# Patient Record
Sex: Male | Born: 1985 | Race: Black or African American | Hispanic: No | Marital: Single | Smoking: Current every day smoker
Health system: Southern US, Community
[De-identification: ages and names within clinical notes are randomized; demographics above are authoritative.]

## PROBLEM LIST (undated history)

## (undated) DIAGNOSIS — J40 Bronchitis, not specified as acute or chronic: Secondary | ICD-10-CM

## (undated) DIAGNOSIS — A549 Gonococcal infection, unspecified: Secondary | ICD-10-CM

## (undated) DIAGNOSIS — A749 Chlamydial infection, unspecified: Secondary | ICD-10-CM

---
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
## 2008-06-27 ENCOUNTER — Emergency Department (HOSPITAL_COMMUNITY): Admission: EM | Admit: 2008-06-27 | Discharge: 2008-06-27 | Payer: Self-pay | Admitting: Emergency Medicine

## 2010-01-04 ENCOUNTER — Emergency Department (HOSPITAL_COMMUNITY): Admission: EM | Admit: 2010-01-04 | Discharge: 2010-01-04 | Payer: Self-pay | Admitting: Family Medicine

## 2010-04-05 IMAGING — CR DG CHEST 2V
2 series · 2 of 2 positions shown · non-contrast
Comparison: None

CLINICAL DATA: Chest pain

CHEST - 2 VIEW

[w chest pa]
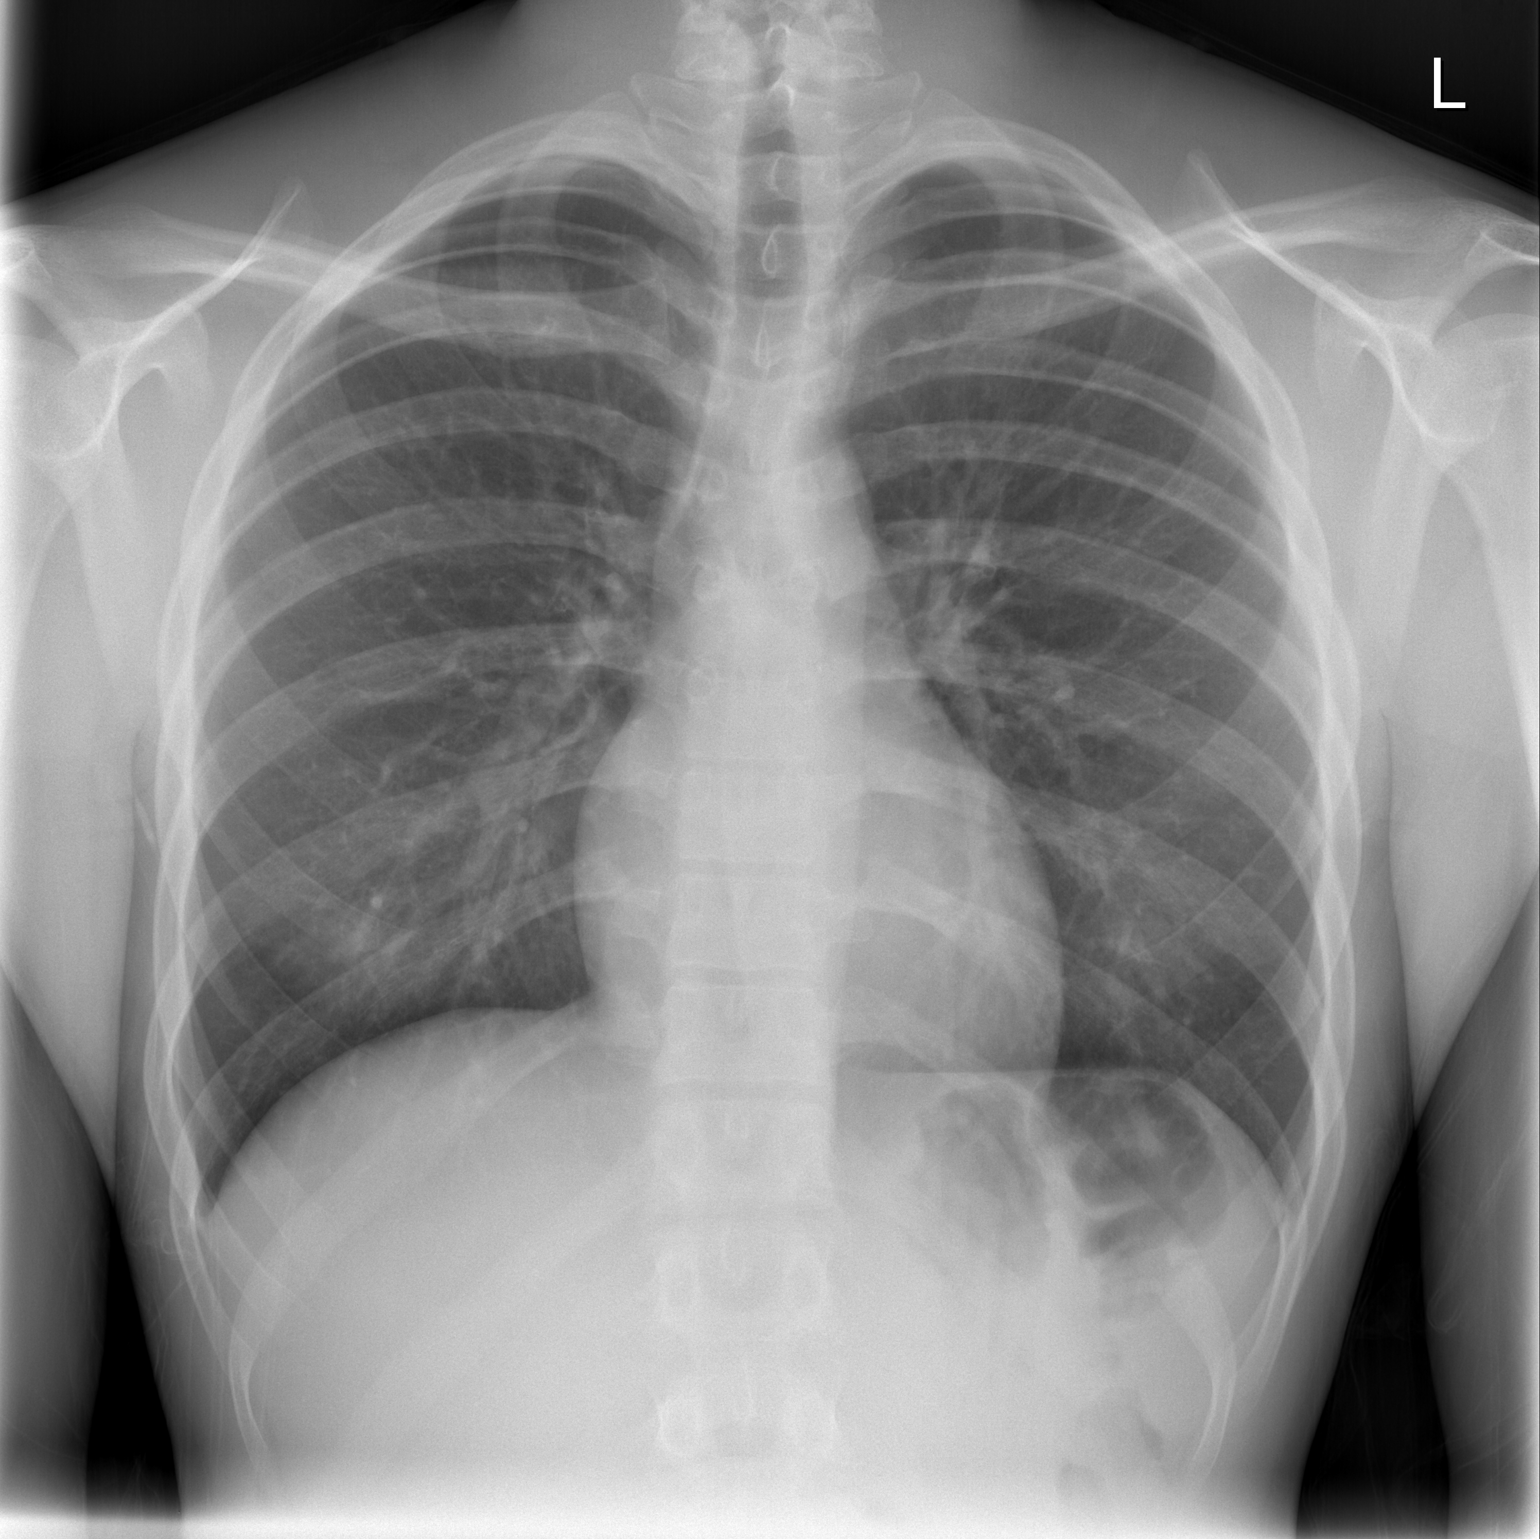

[w chest lat]
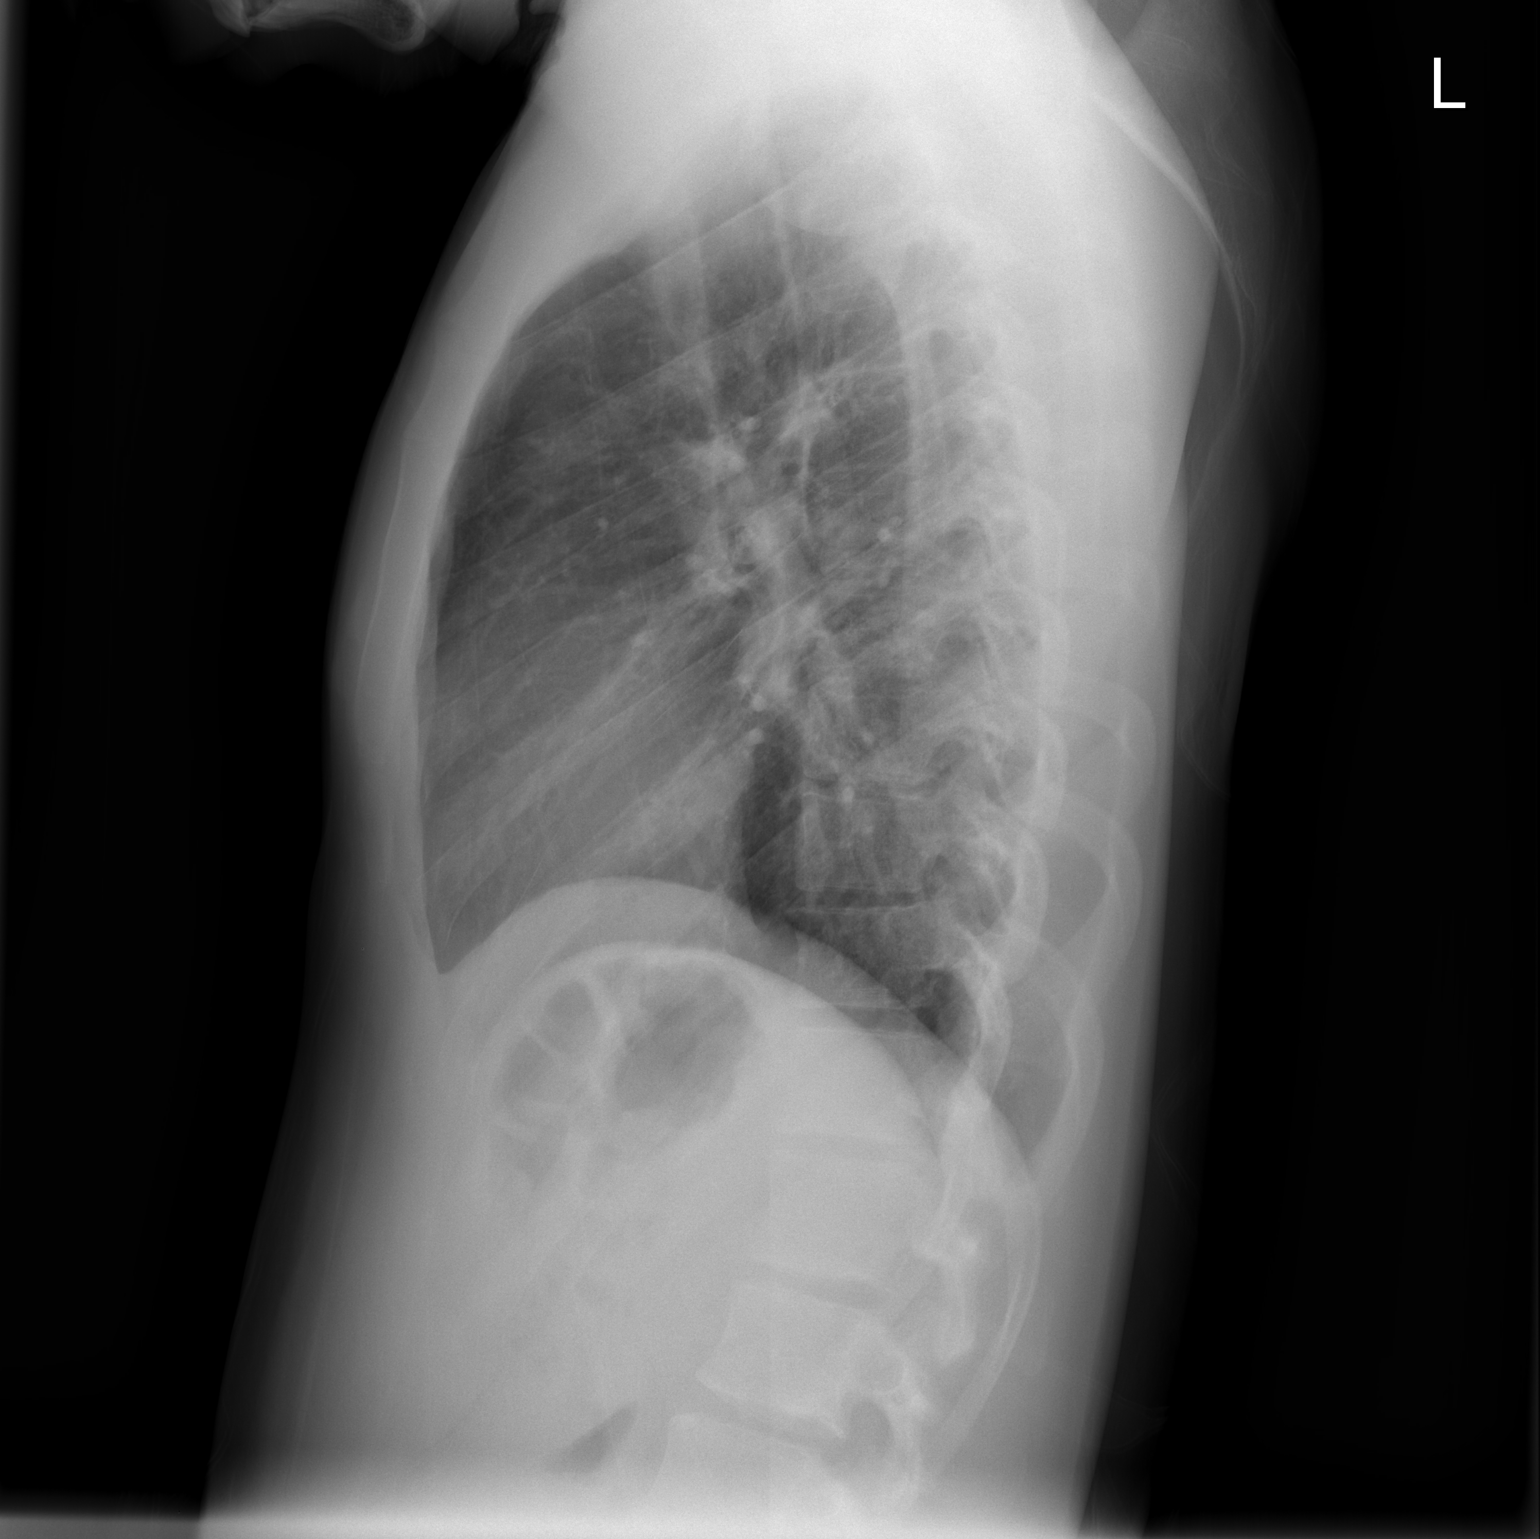

[2 of 2 positions shown; findings below may reference images not displayed]

FINDINGS: The cardiac and mediastinal contours are within normal
limits.  The lungs are clear.  The osseous structures are
unremarkable.
IMPRESSION: No active cardiopulmonary disease.

## 2010-11-22 ENCOUNTER — Inpatient Hospital Stay (INDEPENDENT_AMBULATORY_CARE_PROVIDER_SITE_OTHER)
Admission: RE | Admit: 2010-11-22 | Discharge: 2010-11-22 | Disposition: A | Discharge status: Home / Self Care | Payer: Self-pay | Source: Ambulatory Visit | Attending: Family Medicine | Admitting: Family Medicine

## 2010-11-22 DIAGNOSIS — R198 Other specified symptoms and signs involving the digestive system and abdomen: Secondary | ICD-10-CM

## 2010-11-26 ENCOUNTER — Emergency Department (HOSPITAL_COMMUNITY)
Admission: EM | Admit: 2010-11-26 | Discharge: 2010-11-26 | Disposition: A | Discharge status: Home / Self Care | Payer: Self-pay | Attending: Emergency Medicine | Admitting: Emergency Medicine

## 2010-11-26 DIAGNOSIS — T7840XA Allergy, unspecified, initial encounter: Secondary | ICD-10-CM | POA: Insufficient documentation

## 2010-11-26 DIAGNOSIS — M7989 Other specified soft tissue disorders: Secondary | ICD-10-CM | POA: Insufficient documentation

## 2010-11-26 DIAGNOSIS — IMO0001 Reserved for inherently not codable concepts without codable children: Secondary | ICD-10-CM | POA: Insufficient documentation

## 2010-11-26 DIAGNOSIS — L299 Pruritus, unspecified: Secondary | ICD-10-CM | POA: Insufficient documentation

## 2010-11-26 DIAGNOSIS — I10 Essential (primary) hypertension: Secondary | ICD-10-CM | POA: Insufficient documentation

## 2011-02-17 ENCOUNTER — Emergency Department (INDEPENDENT_AMBULATORY_CARE_PROVIDER_SITE_OTHER)
Admission: EM | Admit: 2011-02-17 | Discharge: 2011-02-17 | Disposition: A | Discharge status: Home / Self Care | Payer: Self-pay | Source: Home / Self Care

## 2011-02-17 ENCOUNTER — Encounter (HOSPITAL_COMMUNITY): Payer: Self-pay | Admitting: *Deleted

## 2011-02-17 DIAGNOSIS — J069 Acute upper respiratory infection, unspecified: Secondary | ICD-10-CM

## 2011-02-17 LAB — POCT RAPID STREP A: Streptococcus, Group A Screen (Direct): NEGATIVE

## 2011-02-17 MED ORDER — IBUPROFEN 800 MG PO TABS: 800.0000 mg | ORAL_TABLET | Freq: Three times a day (TID) | ORAL | Status: AC

## 2011-02-17 NOTE — ED Provider Notes (Signed)
History     CSN: [id number] Arrival date & time: 02/17/2011 11:10 AM   First MD Initiated Contact with Patient 02/17/11 1124      Chief Complaint  Patient presents with  . Sore Throat    pt with onset of sorethroat x 3 days -     (Consider location/radiation/quality/duration/timing/severity/associated sxs/prior treatment) Patient is a 25 y.o. male presenting with pharyngitis. The history is provided by the patient.  Sore Throat This is a new problem. The current episode started 2 days ago. The problem occurs constantly. The problem has not changed since onset.Pertinent negatives include no chest pain, no abdominal pain, no headaches and no shortness of breath. Associated symptoms comments: No fever. The symptoms are relieved by NSAIDs. The treatment provided moderate relief.    History reviewed. No pertinent past medical history.  History reviewed. No pertinent past surgical history.  History reviewed. No pertinent family history.  History  Substance Use Topics  . Smoking status: Not on file  . Smokeless tobacco: Not on file  . Alcohol Use: Not on file      Review of Systems  Constitutional: Negative for fever, chills, activity change, appetite change and fatigue.  HENT: Positive for congestion and sore throat. Negative for ear pain, rhinorrhea, trouble swallowing and voice change.   Eyes: Negative for discharge.  Respiratory: Negative for cough, chest tightness, shortness of breath and wheezing.   Cardiovascular: Negative for chest pain.  Gastrointestinal: Negative for nausea and abdominal pain.  Musculoskeletal: Negative for myalgias and arthralgias.  Skin: Negative for rash.  Neurological: Negative for headaches.    Allergies  Review of patient's allergies indicates no known allergies.  Home Medications   Current Outpatient Rx  Name Route Sig Dispense Refill  . IBUPROFEN 800 MG PO TABS Oral Take 1 tablet (800 mg total) by mouth 3 (three) times daily. 21  tablet 0    BP 122/77  Pulse 65  Temp(Src) 98.3 F (36.8 C) (Oral)  Resp 20  SpO2 98%  Physical Exam  Nursing note and vitals reviewed. Constitutional: He is oriented to person, place, and time. He appears well-developed and well-nourished. No distress.  HENT:  Head: Normocephalic.  Right Ear: External ear normal.  Left Ear: External ear normal.  Mouth/Throat: Oropharynx is clear and moist. No oropharyngeal exudate.       Nasal turbinates with erythema and swelling. Pharyngeal erythema.  Neck: Normal range of motion.  Cardiovascular: Normal rate, regular rhythm and normal heart sounds.   Pulmonary/Chest: Effort normal and breath sounds normal. No respiratory distress. He has no wheezes. He has no rales. He exhibits no tenderness.  Abdominal: Soft. Bowel sounds are normal. There is no tenderness.  Lymphadenopathy:    He has no cervical adenopathy.  Neurological: He is alert and oriented to person, place, and time.  Skin: No rash noted.    ED Course  Procedures (including critical care time)   Labs Reviewed  POCT RAPID STREP A ([facility] CARE ONLY)  LAB REPORT - SCANNED   No results found.   1. URI (upper respiratory infection)       MDM   Negative rapid strep       Kei Mcelhiney Moreno-Coll 02/18/11 0742

## 2012-02-23 ENCOUNTER — Emergency Department (HOSPITAL_COMMUNITY): Payer: Self-pay

## 2012-02-23 ENCOUNTER — Encounter (HOSPITAL_COMMUNITY): Payer: Self-pay | Admitting: Physical Medicine and Rehabilitation

## 2012-02-23 ENCOUNTER — Emergency Department (HOSPITAL_COMMUNITY)
Admission: EM | Admit: 2012-02-23 | Discharge: 2012-02-23 | Disposition: A | Discharge status: Home / Self Care | Payer: Self-pay | Attending: Emergency Medicine | Admitting: Emergency Medicine

## 2012-02-23 DIAGNOSIS — R079 Chest pain, unspecified: Secondary | ICD-10-CM | POA: Insufficient documentation

## 2012-02-23 DIAGNOSIS — F17200 Nicotine dependence, unspecified, uncomplicated: Secondary | ICD-10-CM | POA: Insufficient documentation

## 2012-02-23 MED ORDER — OXYCODONE-ACETAMINOPHEN 5-325 MG PO TABS
1.0000 | ORAL_TABLET | Freq: Once | ORAL | Status: AC
  Administered 2012-02-23: 1 via ORAL
  Filled 2012-02-23: qty 1

## 2012-02-23 MED ORDER — IBUPROFEN 400 MG PO TABS
600.0000 mg | ORAL_TABLET | Freq: Once | ORAL | Status: AC
  Administered 2012-02-23: 600 mg via ORAL
  Filled 2012-02-23: qty 1

## 2012-02-23 MED ORDER — IBUPROFEN 600 MG PO TABS: 600.0000 mg | ORAL_TABLET | Freq: Four times a day (QID) | ORAL | Status: DC | PRN

## 2012-02-23 NOTE — ED Provider Notes (Signed)
History  This chart was scribed for Raeford Razor, MD by Ladona Ridgel Day, ED scribe. This patient was seen in room TR06C/TR06C and the patient's care was started at 1459.   CSN: [id number]  Arrival date & time 02/23/12  1459   None     Chief Complaint  Patient presents with  . Chest Pain    L sided rib pain   Patient is a 26 y.o. male presenting with chest pain. The history is provided by the patient. No language interpreter was used.  Chest Pain The chest pain began 5 - 7 days ago. Chest pain occurs constantly. The chest pain is unchanged. The pain is associated with breathing. The severity of the pain is moderate. The quality of the pain is described as aching. The pain does not radiate. Chest pain is worsened by deep breathing. Pertinent negatives for primary symptoms include no fever, no shortness of breath, no cough, no nausea and no vomiting.  Pertinent negatives for associated symptoms include no weakness. He tried NSAIDs for the symptoms.   Larry Alvarez is a 26 y.o. male who presents to the Emergency Department complaining of constant gradually worsening throbbing left rib pain for the past week worse with deep breaths. He denies any recent injuries to the area and states has been taking ibuprofen at home with minimal relief. He denies any similar previous episodes and has not yet seen anyone for this problem. He is a smoker and denies any leg pain/swelling/recent surgeries or history of DVT/PE.  No past medical history on file.  No past surgical history on file.  History reviewed. No pertinent family history.  History  Substance Use Topics  . Smoking status: Current Every Day Smoker    Types: Cigarettes  . Smokeless tobacco: Not on file  . Alcohol Use: 1.2 oz/week    2 Cans of beer per week      Review of Systems  Constitutional: Negative for fever and chills.  Respiratory: Negative for cough and shortness of breath.   Cardiovascular: Positive for chest pain (left  rib pain worse with deep breaths).  Gastrointestinal: Negative for nausea and vomiting.  Neurological: Negative for weakness.  All other systems reviewed and are negative.    Allergies  Review of patient's allergies indicates no known allergies.  Home Medications   Current Outpatient Rx  Name  Route  Sig  Dispense  Refill  . IBUPROFEN 200 MG PO TABS   Oral   Take 400 mg by mouth every 6 (six) hours as needed. For pain           Triage Vitals: BP 140/72  Pulse 81  Temp 98.6 F (37 C) (Oral)  Resp 18  SpO2 99%  Physical Exam  Nursing note and vitals reviewed. Constitutional: He appears well-developed and well-nourished. No distress.  HENT:  Head: Normocephalic and atraumatic.  Eyes: Conjunctivae normal are normal. Right eye exhibits no discharge. Left eye exhibits no discharge.  Neck: Neck supple.  Cardiovascular: Normal rate, regular rhythm and normal heart sounds.  Exam reveals no gallop and no friction rub.   No murmur heard. Pulmonary/Chest: Effort normal and breath sounds normal. No respiratory distress. He has no wheezes. He has no rales. He exhibits no tenderness.       Lungs clear with good air movement  Abdominal: Soft. He exhibits no distension. There is no tenderness.  Musculoskeletal: He exhibits no edema and no tenderness.       No leg swelling, tenderness, or  edema  Neurological: He is alert.  Skin: Skin is warm and dry.       No skin lesions  Psychiatric: He has a normal mood and affect. His behavior is normal. Thought content normal.    ED Course  Procedures (including critical care time) DIAGNOSTIC STUDIES: Oxygen Saturation is 99% on room air, normal by my interpretation.    COORDINATION OF CARE: At 530 PM Discussed treatment plan with patient which includes CXR. Patient agrees.   Labs Reviewed - No data to display Dg Chest 2 View  02/23/2012  *RADIOLOGY REPORT*  Clinical Data: Shortness of breath and left-sided chest pain for 3 days  CHEST  - 2 VIEW  Comparison: 06/27/2008  Findings:  The heart size and mediastinal contours are within normal limits.  Both lungs are clear.  The visualized skeletal structures are unremarkable. No change from priors.  IMPRESSION: No active cardiopulmonary disease.   Original Report Authenticated By: Davonna Belling, M.D.     EKG:  Rhythm: normal sinus Rate: 67 Axis: normal Intervals: normal ST segments: normal  1. Chest pain       MDM  26yM with CP. EKG with no ischemic changes and normal intervals. CXR with no focal findings. Not tachy. Afebrile. No SOB, no distress on exam and 02 sats good on RA. Doubt ACS, Infectious, PE, Dissection, Pericarditis. Low suspicion for emergent cause and feel safe for outpt fu. Return precautions discussed.  I personally preformed the services scribed in my presence. The recorded information has been reviewed & is accurate. Raeford Razor, MD.          Raeford Razor, MD 02/23/12 [phone]

## 2012-02-23 NOTE — ED Notes (Signed)
Pt returned from radiology.

## 2012-02-23 NOTE — ED Notes (Signed)
Pt c/o left lateral side pain along with some SOB that started a week ago, pt reports he doesn't know what time it started or what he was doing when it started but the pain has progressively gotten worse over the past couple of days. Pt skin is warm dry and pink.

## 2012-02-23 NOTE — ED Notes (Signed)
Pt presents to department for evaluation of L sided rib pain. Ongoing x1 week. Denies recent injury. Pain increases with deep breathing. Respirations unlabored. He is alert and oriented x4. Relief of pain with ibuprofen at home.

## 2012-03-24 ENCOUNTER — Encounter (HOSPITAL_COMMUNITY): Payer: Self-pay | Admitting: Emergency Medicine

## 2012-03-24 ENCOUNTER — Other Ambulatory Visit (HOSPITAL_COMMUNITY)
Admission: RE | Admit: 2012-03-24 | Discharge: 2012-03-24 | Disposition: A | Discharge status: Home / Self Care | Payer: Self-pay | Source: Ambulatory Visit | Attending: Emergency Medicine | Admitting: Emergency Medicine

## 2012-03-24 ENCOUNTER — Emergency Department (INDEPENDENT_AMBULATORY_CARE_PROVIDER_SITE_OTHER)
Admission: EM | Admit: 2012-03-24 | Discharge: 2012-03-24 | Disposition: A | Discharge status: Home / Self Care | Payer: Self-pay | Source: Home / Self Care

## 2012-03-24 DIAGNOSIS — Z113 Encounter for screening for infections with a predominantly sexual mode of transmission: Secondary | ICD-10-CM | POA: Insufficient documentation

## 2012-03-24 DIAGNOSIS — A64 Unspecified sexually transmitted disease: Secondary | ICD-10-CM

## 2012-03-24 DIAGNOSIS — Z202 Contact with and (suspected) exposure to infections with a predominantly sexual mode of transmission: Secondary | ICD-10-CM

## 2012-03-24 MED ORDER — AZITHROMYCIN 250 MG PO TABS
1000.0000 mg | ORAL_TABLET | Freq: Every day | ORAL | Status: DC
  Administered 2012-03-24: 1000 mg via ORAL

## 2012-03-24 MED ORDER — CEFTRIAXONE SODIUM 250 MG IJ SOLR
250.0000 mg | Freq: Once | INTRAMUSCULAR | Status: AC
  Administered 2012-03-24: 250 mg via INTRAMUSCULAR

## 2012-03-24 MED ORDER — AZITHROMYCIN 250 MG PO TABS
ORAL_TABLET | ORAL | Status: AC
  Filled 2012-03-24: qty 4

## 2012-03-24 MED ORDER — LIDOCAINE HCL (PF) 1 % IJ SOLN
INTRAMUSCULAR | Status: AC
  Filled 2012-03-24: qty 5

## 2012-03-24 MED ORDER — CEFTRIAXONE SODIUM 250 MG IJ SOLR
INTRAMUSCULAR | Status: AC
  Filled 2012-03-24: qty 250

## 2012-03-24 NOTE — ED Notes (Signed)
Given gingerale/ice/graham crackers to avoid ingestion of antibiotics on an empty stomach

## 2012-03-24 NOTE — ED Provider Notes (Signed)
Medical screening examination/treatment/procedure(s) were performed by non-physician practitioner and as supervising physician I was immediately available for consultation/collaboration.  Antwan Bribiesca, M.D.   Simranjit Thayer C Phat Dalton, MD 03/24/12 1355 

## 2012-03-24 NOTE — ED Provider Notes (Signed)
History     CSN: [id number]  Arrival date & time 03/24/12  1001   None     Chief Complaint  Patient presents with  . Exposure to STD    (Consider location/radiation/quality/duration/timing/severity/associated sxs/prior treatment) HPI Comments: This 26 year old male presents with a two-day history of burning sensation in the urethra medically upon urination. He also feels a tingling sensation and has witnessed a penile discharge. He denies fever, chills, pelvic pain, back pain or other urinary symptoms such as frequency. He has a history of STD disease.  The history is provided by the patient.    History reviewed. No pertinent past medical history.  History reviewed. No pertinent past surgical history.  No family history on file.  History  Substance Use Topics  . Smoking status: Current Every Day Smoker    Types: Cigarettes  . Smokeless tobacco: Not on file  . Alcohol Use: 1.2 oz/week    2 Cans of beer per week      Review of Systems  All other systems reviewed and are negative.    Allergies  Review of patient's allergies indicates no known allergies.  Home Medications   Current Outpatient Rx  Name  Route  Sig  Dispense  Refill  . IBUPROFEN 200 MG PO TABS   Oral   Take 400 mg by mouth every 6 (six) hours as needed. For pain         . IBUPROFEN 600 MG PO TABS   Oral   Take 1 tablet (600 mg total) by mouth every 6 (six) hours as needed for pain.   30 tablet   0     BP 115/70  Pulse 76  Temp 97.3 F (36.3 C) (Oral)  Resp 18  SpO2 98%  Physical Exam  Constitutional: He is oriented to person, place, and time. He appears well-developed and well-nourished. No distress.  Eyes: Conjunctivae normal and EOM are normal.  Neck: Normal range of motion. Neck supple.  Pulmonary/Chest: Effort normal.  Genitourinary: Penis normal. No penile tenderness.       There is a clear mucoid drainage from the meatus. Penile shaft is intact without apparent lesions. No  palpable regional adenopathy. No tenderness in the epididymal apparatus or testicles. The testicles descended and no palpable nodules or other abnormal contents within the scrotum.  Neurological: He is alert and oriented to person, place, and time. No cranial nerve deficit.  Skin: Skin is warm and dry. No rash noted. No erythema.    ED Course  Procedures (including critical care time)   Labs Reviewed  URINE CYTOLOGY ANCILLARY ONLY   No results found.   1. STD (male)   2. Exposure to STD       MDM  Seen urine for GC Chlamydia Rocephin 200 mg IM now Azithromycin 1000 mg by mouth now Please return instructions for behavior and activity in reference to STD .         Hayden Rasmussen, NP 03/24/12 1049

## 2012-03-24 NOTE — ED Notes (Signed)
Patient reports waking this am with tingling feeling, and discharge noticed this am.  History of gonorrhea.

## 2012-03-24 NOTE — ED Notes (Signed)
Patient aware of post-injection delay prior to discharge per policy.

## 2012-05-05 ENCOUNTER — Encounter (HOSPITAL_COMMUNITY): Payer: Self-pay | Admitting: Emergency Medicine

## 2012-05-05 ENCOUNTER — Other Ambulatory Visit (HOSPITAL_COMMUNITY)
Admission: RE | Admit: 2012-05-05 | Discharge: 2012-05-05 | Disposition: A | Discharge status: Home / Self Care | Payer: Self-pay | Source: Ambulatory Visit | Attending: Family Medicine | Admitting: Family Medicine

## 2012-05-05 ENCOUNTER — Emergency Department (INDEPENDENT_AMBULATORY_CARE_PROVIDER_SITE_OTHER)
Admission: EM | Admit: 2012-05-05 | Discharge: 2012-05-05 | Disposition: A | Discharge status: Home / Self Care | Payer: Self-pay | Source: Home / Self Care | Attending: Family Medicine | Admitting: Family Medicine

## 2012-05-05 DIAGNOSIS — Z113 Encounter for screening for infections with a predominantly sexual mode of transmission: Secondary | ICD-10-CM | POA: Insufficient documentation

## 2012-05-05 DIAGNOSIS — N342 Other urethritis: Secondary | ICD-10-CM

## 2012-05-05 MED ORDER — CEFTRIAXONE SODIUM 250 MG IJ SOLR
250.0000 mg | Freq: Once | INTRAMUSCULAR | Status: AC
  Administered 2012-05-05: 250 mg via INTRAMUSCULAR

## 2012-05-05 MED ORDER — AZITHROMYCIN 250 MG PO TABS
1000.0000 mg | ORAL_TABLET | Freq: Once | ORAL | Status: AC
  Administered 2012-05-05: 1000 mg via ORAL

## 2012-05-05 MED ORDER — CEFTRIAXONE SODIUM 250 MG IJ SOLR
INTRAMUSCULAR | Status: AC
  Filled 2012-05-05: qty 250

## 2012-05-05 MED ORDER — LIDOCAINE HCL (PF) 1 % IJ SOLN
INTRAMUSCULAR | Status: AC
  Filled 2012-05-05: qty 5

## 2012-05-05 MED ORDER — AZITHROMYCIN 250 MG PO TABS
ORAL_TABLET | ORAL | Status: AC
  Filled 2012-05-05: qty 4

## 2012-05-05 NOTE — ED Notes (Signed)
Pt is here for STD exposure Reports white penile discharge x2 days Sx include: dysuria Denies: hematuria, f/v/n/d  He is alert w/no signs of acute distress.

## 2012-05-05 NOTE — ED Provider Notes (Signed)
History     CSN: [id number]  Arrival date & time 05/05/12  1021   First MD Initiated Contact with Patient 05/05/12 1022      Chief Complaint  Patient presents with  . Exposure to STD    (Consider location/radiation/quality/duration/timing/severity/associated sxs/prior treatment) HPI Comments: 27 year old male with history of GC and Chlamydia about 2 months ago [facility] Here complaining of white discharge from penis and burning on urination for 2 days. Denies fever or chills. No abdominal pain. No flank pain. Otherwise feeling well. States he was recently checked for HIV had to help apartment and declining any blood test or further STD screening other than urethral swab today.   History reviewed. No pertinent past medical history.  History reviewed. No pertinent past surgical history.  No family history on file.  History  Substance Use Topics  . Smoking status: Current Every Day Smoker    Types: Cigarettes  . Smokeless tobacco: Not on file  . Alcohol Use: 1.2 oz/week    2 Cans of beer per week      Review of Systems  Constitutional: Negative for fever and chills.  Eyes: Negative for redness and visual disturbance.  Gastrointestinal: Negative for abdominal pain.  Genitourinary: Positive for dysuria, frequency and discharge. Negative for urgency, hematuria, flank pain, scrotal swelling and testicular pain.  Musculoskeletal: Negative for joint swelling and arthralgias.  Skin: Negative for rash.  Neurological: Negative for dizziness and headaches.    Allergies  Review of patient's allergies indicates no known allergies.  Home Medications   Current Outpatient Rx  Name  Route  Sig  Dispense  Refill  . IBUPROFEN 200 MG PO TABS   Oral   Take 400 mg by mouth every 6 (six) hours as needed. For pain         . IBUPROFEN 600 MG PO TABS   Oral   Take 1 tablet (600 mg total) by mouth every 6 (six) hours as needed for pain.   30 tablet   0      BP 119/78  Pulse 70  Temp 98 F (36.7 C) (Oral)  Resp 16  SpO2 100%  Physical Exam  Nursing note and vitals reviewed. Constitutional: He is oriented to person, place, and time. He appears well-developed and well-nourished. No distress.  HENT:  Head: Normocephalic and atraumatic.  Mouth/Throat: No oropharyngeal exudate.  Eyes: No scleral icterus.  Cardiovascular: Normal heart sounds.   Pulmonary/Chest: Breath sounds normal.  Abdominal: Soft. There is no tenderness. Hernia confirmed negative in the right inguinal area and confirmed negative in the left inguinal area.  Genitourinary: Testes normal. Circumcised. No phimosis, paraphimosis or hypospadias. Discharge found.  Lymphadenopathy:    He has no cervical adenopathy.       Right: No inguinal adenopathy present.       Left: No inguinal adenopathy present.  Neurological: He is alert and oriented to person, place, and time.  Skin: No rash noted. He is not diaphoretic.    ED Course  Procedures (including critical care time)   Labs Reviewed  CERVICOVAGINAL ANCILLARY ONLY   No results found.   1. Urethritis       MDM  27 year old male with urethritis. Treated with Rocephin 250 mg IM x1 and a azithromycin 1 g oral x1. Encouraged condoms use. Declined blood sample for HIV testing or other STD screening. GC and Chlamydia test pending at the time of discharge. Supportive care and red flags that should prompt his return  to medical attention discussed with patient and provided in writing.          Sharin Grave, MD 05/07/12 1204

## 2012-07-12 ENCOUNTER — Encounter (HOSPITAL_COMMUNITY): Payer: Self-pay | Admitting: *Deleted

## 2012-07-12 ENCOUNTER — Emergency Department (INDEPENDENT_AMBULATORY_CARE_PROVIDER_SITE_OTHER): Payer: Self-pay

## 2012-07-12 ENCOUNTER — Other Ambulatory Visit (HOSPITAL_COMMUNITY)
Admission: RE | Admit: 2012-07-12 | Discharge: 2012-07-12 | Disposition: A | Discharge status: Home / Self Care | Payer: Self-pay | Source: Ambulatory Visit | Attending: Internal Medicine | Admitting: Internal Medicine

## 2012-07-12 ENCOUNTER — Emergency Department (HOSPITAL_COMMUNITY)
Admission: EM | Admit: 2012-07-12 | Discharge: 2012-07-12 | Disposition: A | Discharge status: Home / Self Care | Payer: Self-pay | Source: Home / Self Care | Attending: Emergency Medicine | Admitting: Emergency Medicine

## 2012-07-12 DIAGNOSIS — Z113 Encounter for screening for infections with a predominantly sexual mode of transmission: Secondary | ICD-10-CM | POA: Insufficient documentation

## 2012-07-12 DIAGNOSIS — S63599A Other specified sprain of unspecified wrist, initial encounter: Secondary | ICD-10-CM

## 2012-07-12 DIAGNOSIS — S66819A Strain of other specified muscles, fascia and tendons at wrist and hand level, unspecified hand, initial encounter: Secondary | ICD-10-CM

## 2012-07-12 DIAGNOSIS — A64 Unspecified sexually transmitted disease: Secondary | ICD-10-CM

## 2012-07-12 MED ORDER — CEFTRIAXONE SODIUM 250 MG IJ SOLR
250.0000 mg | Freq: Once | INTRAMUSCULAR | Status: AC
  Administered 2012-07-12: 250 mg via INTRAMUSCULAR

## 2012-07-12 MED ORDER — AZITHROMYCIN 250 MG PO TABS
ORAL_TABLET | ORAL | Status: AC
  Filled 2012-07-12: qty 4

## 2012-07-12 MED ORDER — LIDOCAINE HCL (PF) 1 % IJ SOLN
INTRAMUSCULAR | Status: AC
  Filled 2012-07-12: qty 5

## 2012-07-12 MED ORDER — AZITHROMYCIN 250 MG PO TABS
1000.0000 mg | ORAL_TABLET | Freq: Once | ORAL | Status: AC
  Administered 2012-07-12: 1000 mg via ORAL

## 2012-07-12 MED ORDER — CEFTRIAXONE SODIUM 250 MG IJ SOLR
INTRAMUSCULAR | Status: AC
  Filled 2012-07-12: qty 250

## 2012-07-12 NOTE — ED Notes (Signed)
Pt  Has   2  Complaints  He  States   He  Struck a  Wall  With  His  r  Hand    On a  Wall   He  Has      Pain  Redness  And  Swelling  To  The  Affected  Hand  -  He  Also  States  He   Has  A  Penile  Discharge        X  1  Week

## 2012-07-12 NOTE — ED Notes (Signed)
Unable to locate discharge instructions

## 2012-07-12 NOTE — ED Provider Notes (Signed)
History     CSN: [id number]  Arrival date & time 07/12/12  1100   First MD Initiated Contact with Patient 07/12/12 1106      Chief Complaint  Patient presents with  . Hand Injury    (Consider location/radiation/quality/duration/timing/severity/associated sxs/prior treatment) HPI     This is a 27 year old male who presents with right hand and wrist pain after hitting a wall 2 days ago. Most of the pain is on the medial aspect of the hand and wrist. There and no lacerations present. He is able to move his fingers but has pain when trying to move his thumb and wrist. He also complains of urethral discharge for about a week and would like to be checked for STDs. He admits to discomfort while urinating but no pus or blood. Discharge is white/clear. No pelvic or testicular pain and no fevers.  History reviewed. No pertinent past surgical history.  No family history on file.  History  Substance Use Topics  . Smoking status: Current Every Day Smoker    Types: Cigarettes  . Smokeless tobacco: Not on file  . Alcohol Use: 1.2 oz/week    2 Cans of beer per week      Review of Systems  Constitutional: Negative.   HENT: Negative.   Eyes: Negative.   Respiratory: Negative.   Cardiovascular: Negative.   Gastrointestinal: Negative.   Endocrine: Negative.   Genitourinary: Positive for dysuria and discharge. Negative for urgency, frequency, hematuria, flank pain, decreased urine volume, penile swelling, scrotal swelling, enuresis, difficulty urinating, genital sores, penile pain and testicular pain.  Musculoskeletal:       Per history of present illness  Skin: Negative.   Neurological: Negative.   Psychiatric/Behavioral: Negative.     Allergies  Review of patient's allergies indicates no known allergies.  Home Medications   No current outpatient prescriptions on file.  BP 120/80  Pulse 68  Temp(Src) 98.1 F (36.7 C) (Oral)  Resp 14  SpO2 99%  Physical Exam   Constitutional: He is oriented to person, place, and time. He appears well-developed and well-nourished.  HENT:  Head: Normocephalic and atraumatic.  Eyes: Conjunctivae and EOM are normal. Pupils are equal, round, and reactive to light.  Neck: Normal range of motion. Neck supple.  Cardiovascular: Normal rate and regular rhythm.   Pulmonary/Chest: Effort normal and breath sounds normal.  Abdominal: Soft. Bowel sounds are normal.  Genitourinary: Penis normal. No penile tenderness.  Musculoskeletal:  Swelling noted in medial aspect of wrist and hand. Range of motion is intact but mild pain as noted when flexing and extending his stump. Tenderness noted in medial aspect of hand and wrist.  Neurological: He is alert and oriented to person, place, and time.  Skin: Skin is warm and dry.    ED Course  Procedures (including critical care time)  Labs Reviewed  URINE CYTOLOGY ANCILLARY ONLY   Dg Wrist Complete Right  07/12/2012  *RADIOLOGY REPORT*  Clinical Data: Right wrist injury with pain.  RIGHT WRIST - COMPLETE 3+ VIEW  Comparison:  None.  Findings:  There is no evidence of fracture or dislocation.  There is no evidence of arthropathy or other focal bone abnormality. Soft tissues are unremarkable.  IMPRESSION: Negative.   Original Report Authenticated By: Irish Lack, M.D.    Dg Hand Complete Right  07/12/2012  *RADIOLOGY REPORT*  Clinical Data: Injury to right hand with pain and abrasions.  RIGHT HAND - COMPLETE 3+ VIEW  Comparison:  None.  Findings:  There  is no evidence of fracture or dislocation.  There is no evidence of arthropathy or other focal bone abnormality. Soft tissues are unremarkable.  IMPRESSION: Negative.   Original Report Authenticated By: Irish Lack, M.D.      1. Wrist sprain and strain, right, initial encounter   2. STD (male)       MDM  Splint given for wrist.Advised to elevate and ice. STD testing-give Rocephin 250 mg IV one time and Zithromax 1 g by mouth  once.         Calvert Cantor, MD 07/12/12 [phone]

## 2012-07-12 NOTE — ED Notes (Signed)
[facility]  r   Wrist   Splint  Applied    

## 2012-07-16 ENCOUNTER — Telehealth (HOSPITAL_COMMUNITY): Payer: Self-pay | Admitting: *Deleted

## 2012-07-16 MED ORDER — METRONIDAZOLE 500 MG PO TABS: 500.0000 mg | ORAL_TABLET | Freq: Two times a day (BID) | ORAL | Status: DC

## 2012-07-16 NOTE — ED Provider Notes (Signed)
Addendum: His DNA probe came back positive for Trichomonas. GC and Chlamydia were negative. He will need treatment with metronidazole 500 mg twice a day for one week. He should inform sexual contacts and had been treated as well.  Reuben Likes, MD 07/16/12 [phone]

## 2012-07-16 NOTE — ED Notes (Signed)
GC/Chlamydia neg., Trich pos.  4/7 Message sent to Dr. Butler Denmark for orders.  4/9 No order noted. Discussed with Dr. Lorenz Coaster and he e-prescribed Flagyl to [facility] on [address]. I called pt.  Pt. verified x 2 and given results. Pt. told he needs Flagyl for Trich. And where to get Rx. He wants it sent to [facility]. I told him he can get it transferred there and how to do it.   Pt. instructed to no alcohol while taking this medication.  Pt. instructed to notify his partner to be treated with Flagyl, no sex until you have finished your medication and your partner has been treated and to practice safe sex. You can get HIV testing at the [facility] STD clinic by appointment.  Pt. voiced understanding. Vassie Moselle 07/16/2012

## 2012-07-18 ENCOUNTER — Emergency Department (HOSPITAL_COMMUNITY)
Admission: EM | Admit: 2012-07-18 | Discharge: 2012-07-18 | Disposition: A | Discharge status: Home / Self Care | Payer: Self-pay | Attending: Emergency Medicine | Admitting: Emergency Medicine

## 2012-07-18 ENCOUNTER — Encounter (HOSPITAL_COMMUNITY): Payer: Self-pay | Admitting: Cardiology

## 2012-07-18 DIAGNOSIS — Z202 Contact with and (suspected) exposure to infections with a predominantly sexual mode of transmission: Secondary | ICD-10-CM | POA: Insufficient documentation

## 2012-07-18 DIAGNOSIS — N489 Disorder of penis, unspecified: Secondary | ICD-10-CM | POA: Insufficient documentation

## 2012-07-18 DIAGNOSIS — F17200 Nicotine dependence, unspecified, uncomplicated: Secondary | ICD-10-CM | POA: Insufficient documentation

## 2012-07-18 LAB — URINALYSIS, ROUTINE W REFLEX MICROSCOPIC
Glucose, UA: NEGATIVE mg/dL
Ketones, ur: NEGATIVE mg/dL
Leukocytes, UA: NEGATIVE
Protein, ur: NEGATIVE mg/dL
Urobilinogen, UA: 1 mg/dL (ref 0.0–1.0)

## 2012-07-18 LAB — URINE MICROSCOPIC-ADD ON

## 2012-07-18 MED ORDER — AZITHROMYCIN 250 MG PO TABS
1000.0000 mg | ORAL_TABLET | Freq: Once | ORAL | Status: AC
  Administered 2012-07-18: 1000 mg via ORAL
  Filled 2012-07-18: qty 4

## 2012-07-18 MED ORDER — CEFTRIAXONE SODIUM 250 MG IJ SOLR
250.0000 mg | Freq: Once | INTRAMUSCULAR | Status: AC
  Administered 2012-07-18: 250 mg via INTRAMUSCULAR
  Filled 2012-07-18: qty 250

## 2012-07-18 NOTE — ED Notes (Signed)
Pt reports he had a partner that tested positive for STD a couple of days ago. Reports he has been symptomatic.

## 2012-07-18 NOTE — ED Provider Notes (Signed)
Medical screening examination/treatment/procedure(s) were performed by non-physician practitioner and as supervising physician I was immediately available for consultation/collaboration.  Ethelda Chick, MD 07/18/12 1728

## 2012-07-18 NOTE — ED Provider Notes (Signed)
History    This chart was scribed for non-physician practitioner working with Ethelda Chick, MD by Toya Smothers, ED Scribe. This patient was seen in room TR09C/TR09C and the patient's care was started at 3:53 PM.   CSN: [id number]  Arrival date & time 07/18/12  1450   First MD Initiated Contact with Patient 07/18/12 1529      Chief Complaint  Patient presents with  . SEXUALLY TRANSMITTED DISEASE    The history is provided by the patient. No language interpreter was used.    Larry Alvarez is a 27 y.o. male who presents to the ED complaining of 2 days of gradual onset, constant, unchanged mild penile pain without aggravation. Pt states that his partner was recently diagnosed with Trichomoniasis. No discharge, swelling, rash, or genital sores. .Symptoms have not been treated PTA. Pt is sexually active, admitting alcohol, tobacco, and marijuana use.    History reviewed. No pertinent past medical history.  History reviewed. No pertinent past surgical history.  History reviewed. No pertinent family history.  History  Substance Use Topics  . Smoking status: Current Every Day Smoker    Types: Cigarettes  . Smokeless tobacco: Not on file  . Alcohol Use: 1.2 oz/week    2 Cans of beer per week      Review of Systems  Genitourinary: Positive for penile pain.  All other systems reviewed and are negative.    Allergies  Review of patient's allergies indicates no known allergies.  Home Medications   Current Outpatient Rx  Name  Route  Sig  Dispense  Refill  . acetaminophen (TYLENOL) 325 MG tablet   Oral   Take 650 mg by mouth every 6 (six) hours as needed for pain.           BP 128/74  Pulse 67  Temp(Src) 98.7 F (37.1 C) (Oral)  Resp 16  SpO2 99%  Physical Exam  Nursing note and vitals reviewed. Constitutional: He is oriented to person, place, and time. He appears well-developed and well-nourished. No distress.  HENT:  Head: Normocephalic and atraumatic.   Eyes: EOM are normal.  Neck: Neck supple. No tracheal deviation present.  Cardiovascular: Normal rate.   Pulmonary/Chest: Effort normal. No respiratory distress.  Genitourinary:  Circumcised male. No lesions. No palpable masses to the penis or scrotum. No discharge.  Musculoskeletal: Normal range of motion.  Neurological: He is alert and oriented to person, place, and time.  Skin: Skin is warm and dry.  Psychiatric: He has a normal mood and affect. His behavior is normal.    ED Course  Procedures  DIAGNOSTIC STUDIES: Oxygen Saturation is 99% on room ari, normal by my interpretation.    COORDINATION OF CARE: 15:53- Evaluated Pt. Pt is awake, alert, and without distress. 15:59- Patient understands and agrees with initial ED impression and plan with expectations set for ED visit.     Results for orders placed during the hospital encounter of 07/18/12  URINALYSIS, ROUTINE W REFLEX MICROSCOPIC      Result Value Range   Color, Urine YELLOW  YELLOW   APPearance CLEAR  CLEAR   Specific Gravity, Urine 1.022  1.005 - 1.030   pH 7.0  5.0 - 8.0   Glucose, UA NEGATIVE  NEGATIVE mg/dL   Hgb urine dipstick TRACE (*) NEGATIVE   Bilirubin Urine NEGATIVE  NEGATIVE   Ketones, ur NEGATIVE  NEGATIVE mg/dL   Protein, ur NEGATIVE  NEGATIVE mg/dL   Urobilinogen, UA 1.0  0.0 - 1.0  mg/dL   Nitrite NEGATIVE  NEGATIVE   Leukocytes, UA NEGATIVE  NEGATIVE     1. Exposure to STD       MDM  Patient with STD exposure.  States that he feels different in his penis, but does not have any discharge, burning, stinging, or increased frequency or urgency.  Will treat with rocephin and azithromycin.  Will check UA.    UA is negative.  Patient treated, and is stable and ready for discharge.   I personally performed the services described in this documentation, which was scribed in my presence. The recorded information has been reviewed and is accurate.     Roxy Horseman, PA-C 07/18/12 1710

## 2012-07-19 LAB — GC/CHLAMYDIA PROBE AMP
CT Probe RNA: NEGATIVE
GC Probe RNA: NEGATIVE

## 2012-08-30 ENCOUNTER — Encounter (HOSPITAL_COMMUNITY): Payer: Self-pay | Admitting: Family Medicine

## 2012-08-30 ENCOUNTER — Emergency Department (HOSPITAL_COMMUNITY)
Admission: EM | Admit: 2012-08-30 | Discharge: 2012-08-30 | Disposition: A | Discharge status: Home / Self Care | Payer: Self-pay | Attending: Emergency Medicine | Admitting: Emergency Medicine

## 2012-08-30 DIAGNOSIS — R369 Urethral discharge, unspecified: Secondary | ICD-10-CM | POA: Insufficient documentation

## 2012-08-30 DIAGNOSIS — F17200 Nicotine dependence, unspecified, uncomplicated: Secondary | ICD-10-CM | POA: Insufficient documentation

## 2012-08-30 DIAGNOSIS — Z202 Contact with and (suspected) exposure to infections with a predominantly sexual mode of transmission: Secondary | ICD-10-CM

## 2012-08-30 DIAGNOSIS — Z23 Encounter for immunization: Secondary | ICD-10-CM | POA: Insufficient documentation

## 2012-08-30 DIAGNOSIS — R300 Dysuria: Secondary | ICD-10-CM | POA: Insufficient documentation

## 2012-08-30 MED ORDER — LIDOCAINE HCL (PF) 1 % IJ SOLN
INTRAMUSCULAR | Status: AC
  Administered 2012-08-30: 2.5 mL
  Filled 2012-08-30: qty 5

## 2012-08-30 MED ORDER — AZITHROMYCIN 250 MG PO TABS
1000.0000 mg | ORAL_TABLET | Freq: Once | ORAL | Status: AC
  Administered 2012-08-30: 1000 mg via ORAL
  Filled 2012-08-30: qty 4

## 2012-08-30 MED ORDER — CEFTRIAXONE SODIUM 250 MG IJ SOLR
250.0000 mg | Freq: Once | INTRAMUSCULAR | Status: AC
  Administered 2012-08-30: 250 mg via INTRAMUSCULAR
  Filled 2012-08-30: qty 250

## 2012-08-30 NOTE — ED Notes (Signed)
Pt sts 3 days of burning sensation and discharge. Exposed to STD

## 2012-08-30 NOTE — Discharge Instructions (Signed)
Be sure to have all sexual partners tried prior to engaging in sexual intercourse. Use barrier protect such as condoms.   Gonorrhea, Females and Males Gonorrhea is an infection. Gonorrhea can be treated with medicines that kill germs (antibiotics). It is necessary that all your sexual partners also be tested for infection and possibly be treated.  CAUSES  Gonorrhea is caused by a germ (bacteria) called Neisseria gonorrhoeae. This infection is spread by sexual contact. The contact that spreads gonorrhea from person to person may be oral, anal, or genital sex. SYMPTOMS  Females A woman may have gonorrhea infection and no symptoms. The most common symptoms are:  Pain in the lower abdomen.  Fever, with or without chills. When these are the most serious problems, the illness is commonly called pelvic inflammatory disease (PID). Other symptoms include:  Abnormal vaginal discharge.  Painful intercourse.  Burning or itching of the vagina or lips of the vagina.  Abnormal vaginal bleeding.  Pain when urinating. If the infection is spread by anal sex:  Irritation, pain, bleeding, or discharge from the rectum. If the infection is spread by oral sex with either a man or a woman:  Sore throat, fever, and swollen neck lymph glands. Other problems may include:  Long-lasting (chronic) pain in the lower abdomen during menstruation, intercourse, or at other times.  Inability to become pregnant.  Premature birth.  Passing the infection onto a newborn baby. This can cause an eye infection in the infant or more serious health problems. Males Less frequently than in women, men may have gonorrhea infection and no symptoms. The most common symptoms are:  Discharge from the penis.  Pain or burning during urination. If the infection is spread by anal sex:  Irritation, pain, bleeding, or discharge from the rectum. If the infection is spread by oral sex with either a man or a woman:  Sore  throat, fever, and swollen neck lymph glands. DIAGNOSIS  Diagnosis is made by exam of the patient and checking a sample of discharge under a microscope for the presence of the bacteria. Discharge may be taken from the urethra, cervix, throat, or rectum. TREATMENT  It is important to diagnose and treat gonorrhea as soon as possible. This prevents damage to the male or male organs or harm to the newborn baby of an infected woman.  Antibiotics are used to treat gonorrhea.  Your sex partners should also be examined and treated if needed.  Testing and treatment for other sexually transmitted diseases (STDs) may be done when you are diagnosed with gonorrhea. Gonorrhea is an STD. You are at risk for other STDs, which are often transmitted around the same time as gonorrhea. These include:  Chlamydia.  Syphilis.  Trichomonas.  Human papillomavirus (HPV).  Human immunodeficiency virus (HIV).  If left untreated, PID can cause women to be unable to have children (sterile). To prevent sterility in females, it is important to be treated as soon as possible and finish all medicines. Unfortunately, sterility or pregnancy occurring outside the uterus (ectopic) may still occur in fully treated women. HOME CARE INSTRUCTIONS   Finish all medicine as prescribed. Incomplete treatment will put you at risk for continued infection.  Only take over-the-counter or prescription medicines for pain, discomfort, or fever as directed by your caregiver.  Do not have sex until treatment is completed, or as instructed by your caregiver.  Follow up with your caregiver as directed.  If you test positive for gonorrhea, inform your recent sexual partners. They may need  an exam and treatment, even if they have no symptoms. They may need treatment even if they test negative for gonorrhea. Finding out the results of your test Not all test results are available during your visit. If your test results are not back during  the visit, make an appointment with your caregiver to find out the results. Do not assume everything is normal if you have not heard from your caregiver or the medical facility. It is important for you to follow up on all of your test results. SEEK MEDICAL CARE IF:   You develop any bad reaction to the medicine you were prescribed. This may include:  Rash.  Nausea.  Vomiting.  Diarrhea.  You have an oral temperature above 102 F (38.9 C).  You have symptoms that do not improve, symptoms that get worse, or you develop increased pain. Males may get pain in the testicles and females may get increased abdominal pain. MAKE SURE YOU:   Understand these instructions.  Will watch your condition.  Will get help right away if you are not doing well or get worse. Document Released: 03/23/2000 Document Revised: 06/18/2011 Document Reviewed: 07/26/2009 [facility] Patient Information 2014 [place], [place].

## 2012-08-30 NOTE — ED Provider Notes (Signed)
History     CSN: [id number]  Arrival date & time 08/30/12  0905   First MD Initiated Contact with Patient 08/30/12 [phone]      Chief Complaint  Patient presents with  . Exposure to STD    (Consider location/radiation/quality/duration/timing/severity/associated sxs/prior treatment) HPI Pt is a 27 yo male presenting today with dysuria and penile discharge that is white in color x3 days.  Pt was tx empirically last month for known exposure to STD.  That test came back negative.  Pt was re exposed to and STD, believed to be trichonosis.  Nothing makes symptoms better or worse. Denies fever, chills, n/v/d.     History reviewed. No pertinent past medical history.  History reviewed. No pertinent past surgical history.  History reviewed. No pertinent family history.  History  Substance Use Topics  . Smoking status: Current Every Day Smoker    Types: Cigarettes  . Smokeless tobacco: Not on file  . Alcohol Use: 1.2 oz/week    2 Cans of beer per week      Review of Systems  Constitutional: Negative for fever and chills.  Gastrointestinal: Negative for nausea, vomiting, abdominal pain and diarrhea.  Genitourinary: Positive for dysuria and discharge.  All other systems reviewed and are negative.    Allergies  Review of patient's allergies indicates no known allergies.  Home Medications  No current outpatient prescriptions on file.  BP 107/73  Pulse 62  Temp(Src) 97.9 F (36.6 C)  Resp 18  SpO2 99%  Physical Exam  Nursing note and vitals reviewed. Constitutional: He appears well-developed and well-nourished. No distress.  HENT:  Head: Normocephalic and atraumatic.  Eyes: Conjunctivae are normal. No scleral icterus.  Neck: Normal range of motion.  Cardiovascular: Normal rate, regular rhythm and normal heart sounds.   Pulmonary/Chest: Effort normal and breath sounds normal. No respiratory distress. He has no wheezes.  Abdominal: Soft. Bowel sounds are normal. He  exhibits no distension and no mass. There is no tenderness. There is no rebound and no guarding.  Genitourinary: Penis normal. No penile erythema or penile tenderness. No discharge found.  Musculoskeletal: Normal range of motion.  Neurological: He is alert.  Skin: Skin is warm and dry. He is not diaphoretic.    ED Course  Procedures (including critical care time)  Labs Reviewed  GC/CHLAMYDIA PROBE AMP   No results found.   1. Exposure to STD       MDM  Pt presented with dysuria and penile discharge x4 days.  Known exposure to STD.  Was treated last month for exposure to STD.  Those tests came back neg but he was treated empirically.  PE: nl today, no rash or lesion of genitals, no ttp, or penile discharge.  Repeated swab test Tx empirically again- azithromycin and rocephin.  Informed pt of importance of having all sexual partners tx prior to engaging in sexual intercourse and to use barrier protect at all times, such as a condom.  Pt verbalized understanding.  Advised to go to [facility] for further STD evaluation and treatment.   Vitals: unremarkable. Discharged in stable condition.                Junius Finner, PA-C 08/30/12 1101  Junius Finner, PA-C 08/30/12 1101

## 2012-08-31 LAB — GC/CHLAMYDIA PROBE AMP
CT Probe RNA: NEGATIVE
GC Probe RNA: NEGATIVE

## 2012-08-31 NOTE — ED Provider Notes (Signed)
Medical screening examination/treatment/procedure(s) were performed by non-physician practitioner and as supervising physician I was immediately available for consultation/collaboration.   Loren Racer, MD 08/31/12 [phone]

## 2012-09-09 ENCOUNTER — Encounter (HOSPITAL_COMMUNITY): Payer: Self-pay | Admitting: *Deleted

## 2012-09-09 ENCOUNTER — Emergency Department (HOSPITAL_COMMUNITY)
Admission: EM | Admit: 2012-09-09 | Discharge: 2012-09-09 | Disposition: A | Discharge status: Home / Self Care | Payer: Self-pay | Attending: Emergency Medicine | Admitting: Emergency Medicine

## 2012-09-09 DIAGNOSIS — R300 Dysuria: Secondary | ICD-10-CM | POA: Insufficient documentation

## 2012-09-09 DIAGNOSIS — F17200 Nicotine dependence, unspecified, uncomplicated: Secondary | ICD-10-CM | POA: Insufficient documentation

## 2012-09-09 DIAGNOSIS — N342 Other urethritis: Secondary | ICD-10-CM | POA: Insufficient documentation

## 2012-09-09 MED ORDER — AZITHROMYCIN 250 MG PO TABS
1000.0000 mg | ORAL_TABLET | Freq: Once | ORAL | Status: AC
  Administered 2012-09-09: 1000 mg via ORAL
  Filled 2012-09-09: qty 4

## 2012-09-09 MED ORDER — CEFTRIAXONE SODIUM 250 MG IJ SOLR
250.0000 mg | INTRAMUSCULAR | Status: DC
  Administered 2012-09-09: 250 mg via INTRAMUSCULAR
  Filled 2012-09-09: qty 250

## 2012-09-09 NOTE — ED Notes (Signed)
Pt is here clear, milky white penile discharge and has pain with urination

## 2012-09-09 NOTE — ED Notes (Signed)
Patient states penile discharge.  Patient advised he is still having unprotected oral sex.  Patient states "I was just seen her a few weeks ago for the same thing".

## 2012-09-09 NOTE — ED Provider Notes (Addendum)
History     CSN: [id number]  Arrival date & time 09/09/12  1152   First MD Initiated Contact with Patient 09/09/12 1204      Chief Complaint  Patient presents with  . Penile Discharge    (Consider location/radiation/quality/duration/timing/severity/associated sxs/prior treatment) HPI  Larry Alvarez is a 27 y.o. male complaining of milky white penile discharge and dysuria starting 2 days ago. Patient had unprotected oral sex several days ago sex. He denies fever, abdominal pain, nausea vomiting, testicular pain, rashes.    History reviewed. No pertinent past medical history.  History reviewed. No pertinent past surgical history.  No family history on file.  History  Substance Use Topics  . Smoking status: Current Every Day Smoker    Types: Cigarettes  . Smokeless tobacco: Not on file  . Alcohol Use: 1.2 oz/week    2 Cans of beer per week      Review of Systems  Constitutional: Negative for fever.  Respiratory: Negative for shortness of breath.   Cardiovascular: Negative for chest pain.  Gastrointestinal: Negative for nausea, vomiting, abdominal pain and diarrhea.  Genitourinary: Positive for dysuria and discharge. Negative for scrotal swelling and testicular pain.  All other systems reviewed and are negative.    Allergies  Review of patient's allergies indicates no known allergies.  Home Medications  No current outpatient prescriptions on file.  BP 113/69  Pulse 72  Temp(Src) 98.1 F (36.7 C) (Oral)  Resp 16  SpO2 96%  Physical Exam  Nursing note and vitals reviewed. Constitutional: He is oriented to person, place, and time. He appears well-developed and well-nourished. No distress.  HENT:  Head: Normocephalic.  Eyes: Conjunctivae and EOM are normal.  Cardiovascular: Normal rate.   Pulmonary/Chest: Effort normal. No stridor.  Genitourinary:  GU exam chaperoned by technician. No testicular tenderness to palpation, no urethral discharge.   Musculoskeletal: Normal range of motion.  Neurological: He is alert and oriented to person, place, and time.  Psychiatric: He has a normal mood and affect.    ED Course  Procedures (including critical care time)  Labs Reviewed  GC/CHLAMYDIA PROBE AMP   No results found.   1. Urethritis       MDM   Filed Vitals:   09/09/12 1154 09/09/12 1345  BP: 113/69 113/77  Pulse: 72 57  Temp: 98.1 F (36.7 C) 97.9 F (36.6 C)  TempSrc: Oral Oral  Resp: 16 16  SpO2: 96% 100%     Larry Alvarez is a 27 y.o. male with your E. thrice. Patient has been seen here for similar complaint multiple times. I encouraged him to do use barrier protection during all sexual activities. I will treat him prophylactically for GC Chlamydia. I've encouraged him to a [facility] I have encouraged him to refrain from sexual activity until he is a result of the full STD screen.  Medications  cefTRIAXone (ROCEPHIN) injection 250 mg (250 mg Intramuscular Given 09/09/12 1356)  azithromycin (ZITHROMAX) tablet 1,000 mg (1,000 mg Oral Given 09/09/12 1356)    The patient is hemodynamically stable, appropriate for, and amenable to, discharge at this time. Pt verbalized understanding and agrees with care plan. Outpatient follow-up and return precautions given.         Wynetta Emery, PA-C 09/09/12 1625  Jaris Kohles, PA-C 09/18/12 1951

## 2012-09-10 LAB — GC/CHLAMYDIA PROBE AMP
CT Probe RNA: NEGATIVE
GC Probe RNA: NEGATIVE

## 2012-09-11 NOTE — ED Provider Notes (Signed)
Medical screening examination/treatment/procedure(s) were performed by non-physician practitioner and as supervising physician I was immediately available for consultation/collaboration.   Charles B. Bernette Mayers, MD 09/11/12 1555

## 2012-09-18 NOTE — ED Provider Notes (Signed)
Medical screening examination/treatment/procedure(s) were performed by non-physician practitioner and as supervising physician I was immediately available for consultation/collaboration.   Charles B. Bernette Mayers, MD 09/18/12 2312

## 2013-02-02 ENCOUNTER — Emergency Department (HOSPITAL_COMMUNITY)
Admission: EM | Admit: 2013-02-02 | Discharge: 2013-02-02 | Disposition: A | Discharge status: Home / Self Care | Payer: Self-pay | Attending: Emergency Medicine | Admitting: Emergency Medicine

## 2013-02-02 ENCOUNTER — Encounter (HOSPITAL_COMMUNITY): Payer: Self-pay | Admitting: Emergency Medicine

## 2013-02-02 DIAGNOSIS — F17200 Nicotine dependence, unspecified, uncomplicated: Secondary | ICD-10-CM | POA: Insufficient documentation

## 2013-02-02 DIAGNOSIS — N342 Other urethritis: Secondary | ICD-10-CM | POA: Insufficient documentation

## 2013-02-02 MED ORDER — AZITHROMYCIN 1 G PO PACK
1.0000 g | PACK | Freq: Once | ORAL | Status: AC
  Administered 2013-02-02: 1 g via ORAL
  Filled 2013-02-02: qty 1

## 2013-02-02 MED ORDER — CEFTRIAXONE SODIUM 250 MG IJ SOLR
250.0000 mg | Freq: Once | INTRAMUSCULAR | Status: AC
  Administered 2013-02-02: 250 mg via INTRAMUSCULAR
  Filled 2013-02-02: qty 250

## 2013-02-02 MED ORDER — LIDOCAINE HCL (PF) 1 % IJ SOLN
INTRAMUSCULAR | Status: AC
  Administered 2013-02-02: 0.9 mL
  Filled 2013-02-02: qty 5

## 2013-02-02 NOTE — Discharge Instructions (Signed)
Urethritis, Adult  Urethritis is an inflammation (soreness) of the urethra (the tube exiting from the bladder). It is often caused by germs that may be spread through sexual contact.  TREATMENT   Urethritis will usually respond to antibiotics. These are medications that kill germs. Take all the medicine given to you. You may feel better in a couple days, but TAKE ALL MEDICINE or the infection may not be completely cured and may become more difficult to treat. Response can generally be expected in 7 to 10 days. You may require additional treatment after more testing.  HOME CARE INSTRUCTIONS   Not have sex until the test results are known and treatment is completed.   Know that you may be asked to notify your sex partner when your final test results are back.   Finish all medications as prescribed.   Prevent sexually transmitted infections including AIDS. Practice safe sex. Use condoms.  SEEK MEDICAL CARE IF:    Your symptoms are not improved in 2 to 3 days.   Your symptoms are getting worse.   Your develop abdominal pain.   You develop joint pain.  SEEK IMMEDIATE MEDICAL CARE IF:    You have a fever.   You develop severe pain in the belly, back or side.   You develop repeated vomiting.  TEST RESULTS  Not all test results are available during your visit. If your test results are not back during the visit, make an appointment with your caregiver to find out the results. Do not assume everything is normal if you have not heard from your caregiver or the medical facility. It is important for you to follow-up on all of your test results.  Document Released: 09/19/2000 Document Revised: 06/18/2011 Document Reviewed: 04/11/2009  [employer] Patient Information 2014 [employer].

## 2013-02-02 NOTE — ED Provider Notes (Signed)
CSN: [id number]     Arrival date & time 02/02/13  0913 History   None    Chief Complaint  Patient presents with  . Exposure to STD   (Consider location/radiation/quality/duration/timing/severity/associated sxs/prior Treatment) Patient is a 27 y.o. male presenting with STD exposure. The history is provided by the patient. No language interpreter was used.  Exposure to STD This is a new problem. The current episode started in the past 7 days. The problem has been unchanged. Pertinent negatives include no chills or fever.  pt is a 27 year old male who presents with a 2 day history of urethral discharge. He reports that he has had a new sex partner. He says that he used a condom but it broke. It is unknown whether his partner has been treated for anything. He denies fever, chills, testicular pain, nausea or vomiting. He denies dysuria or other urinary symptoms. He reports that his discharge has been milky and white.    History reviewed. No pertinent past medical history. No past surgical history on file. No family history on file. History  Substance Use Topics  . Smoking status: Current Every Day Smoker    Types: Cigarettes  . Smokeless tobacco: Not on file  . Alcohol Use: 1.2 oz/week    2 Cans of beer per week    Review of Systems  Constitutional: Negative for fever and chills.  Genitourinary: Positive for discharge and penile pain. Negative for dysuria, urgency, hematuria, decreased urine volume, penile swelling, difficulty urinating, genital sores and testicular pain.  All other systems reviewed and are negative.    Allergies  Review of patient's allergies indicates no known allergies.  Home Medications   Current Outpatient Rx  Name  Route  Sig  Dispense  Refill  . Pseudoeph-Doxylamine-DM-APAP (NYQUIL PO)   Oral   Take 2 capsules by mouth once.          BP 110/68  Pulse 68  Temp(Src) 98.5 F (36.9 C) (Oral)  Resp 18  Wt 150 lb (68.04 kg)  SpO2 100% Physical Exam   Nursing note and vitals reviewed. Constitutional: He is oriented to person, place, and time. He appears well-developed and well-nourished. No distress.  HENT:  Head: Normocephalic and atraumatic.  Neck: Normal range of motion. Neck supple.  Cardiovascular: Normal rate, regular rhythm and normal heart sounds.   Pulmonary/Chest: Effort normal and breath sounds normal. No respiratory distress.  Abdominal: Soft. Bowel sounds are normal. He exhibits no distension. There is no tenderness.  Genitourinary: Testes normal. Circumcised. No penile erythema or penile tenderness. Discharge found.  Musculoskeletal: Normal range of motion.  Neurological: He is alert and oriented to person, place, and time.  Skin: Skin is warm and dry.    ED Course  Procedures (including critical care time) Labs Review Labs Reviewed - No data to display Imaging Review No results found.  EKG Interpretation   None       MDM   1. Urethritis    Whitish penile discharge. New sex partner. Discussed testing, culture and treatment, also partner should be treated and he agreed. Treated here with ceftriaxone 250mg  IM and Azithro. 1gm PO.     Irish Elders, NP 02/02/13 1036

## 2013-02-02 NOTE — ED Notes (Signed)
Pt is here with std symptoms of milky white discharge from his penis.

## 2013-02-03 LAB — GC/CHLAMYDIA PROBE AMP: CT Probe RNA: POSITIVE — AB

## 2013-02-03 NOTE — ED Provider Notes (Signed)
Medical screening examination/treatment/procedure(s) were performed by non-physician practitioner and as supervising physician I was immediately available for consultation/collaboration.  EKG Interpretation   None         Shelda Jakes, MD 02/03/13 [phone]

## 2013-02-04 ENCOUNTER — Telehealth (HOSPITAL_COMMUNITY): Payer: Self-pay | Admitting: *Deleted

## 2013-02-04 NOTE — ED Notes (Signed)
+   Chlamydia Patient treated with Rocephin And Zithromax-[facility] faxed 

## 2013-02-08 ENCOUNTER — Emergency Department (HOSPITAL_COMMUNITY): Payer: Self-pay

## 2013-02-08 ENCOUNTER — Encounter (HOSPITAL_COMMUNITY): Payer: Self-pay | Admitting: Emergency Medicine

## 2013-02-08 ENCOUNTER — Emergency Department (HOSPITAL_COMMUNITY)
Admission: EM | Admit: 2013-02-08 | Discharge: 2013-02-08 | Disposition: A | Discharge status: Home / Self Care | Payer: Self-pay | Attending: Emergency Medicine | Admitting: Emergency Medicine

## 2013-02-08 DIAGNOSIS — R079 Chest pain, unspecified: Secondary | ICD-10-CM

## 2013-02-08 DIAGNOSIS — S298XXA Other specified injuries of thorax, initial encounter: Secondary | ICD-10-CM | POA: Insufficient documentation

## 2013-02-08 DIAGNOSIS — F17200 Nicotine dependence, unspecified, uncomplicated: Secondary | ICD-10-CM | POA: Insufficient documentation

## 2013-02-08 DIAGNOSIS — Y92410 Unspecified street and highway as the place of occurrence of the external cause: Secondary | ICD-10-CM | POA: Insufficient documentation

## 2013-02-08 DIAGNOSIS — S39012A Strain of muscle, fascia and tendon of lower back, initial encounter: Secondary | ICD-10-CM

## 2013-02-08 DIAGNOSIS — S335XXA Sprain of ligaments of lumbar spine, initial encounter: Secondary | ICD-10-CM | POA: Insufficient documentation

## 2013-02-08 DIAGNOSIS — Y939 Activity, unspecified: Secondary | ICD-10-CM | POA: Insufficient documentation

## 2013-02-08 MED ORDER — HYDROCODONE-ACETAMINOPHEN 5-325 MG PO TABS: 2.0000 | ORAL_TABLET | Freq: Four times a day (QID) | ORAL | Status: DC | PRN

## 2013-02-08 MED ORDER — IBUPROFEN 800 MG PO TABS: 800.0000 mg | ORAL_TABLET | Freq: Three times a day (TID) | ORAL | Status: DC

## 2013-02-08 NOTE — ED Notes (Signed)
Patient transported to X-ray 

## 2013-02-08 NOTE — ED Notes (Signed)
Unable to contact patient via phone. Sent letter. °
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
## 2013-05-19 ENCOUNTER — Emergency Department (HOSPITAL_COMMUNITY)
Admission: EM | Admit: 2013-05-19 | Discharge: 2013-05-19 | Disposition: A | Discharge status: Home / Self Care | Payer: No Typology Code available for payment source | Attending: Emergency Medicine | Admitting: Emergency Medicine

## 2013-05-19 ENCOUNTER — Encounter (HOSPITAL_COMMUNITY): Payer: Self-pay | Admitting: Emergency Medicine

## 2013-05-19 DIAGNOSIS — N342 Other urethritis: Secondary | ICD-10-CM | POA: Insufficient documentation

## 2013-05-19 DIAGNOSIS — F17200 Nicotine dependence, unspecified, uncomplicated: Secondary | ICD-10-CM | POA: Insufficient documentation

## 2013-05-19 MED ORDER — AZITHROMYCIN 250 MG PO TABS
1000.0000 mg | ORAL_TABLET | Freq: Once | ORAL | Status: AC
  Administered 2013-05-19: 1000 mg via ORAL
  Filled 2013-05-19: qty 4

## 2013-05-19 MED ORDER — LIDOCAINE HCL (PF) 1 % IJ SOLN
INTRAMUSCULAR | Status: AC
  Administered 2013-05-19: 0.9 mL
  Filled 2013-05-19: qty 5

## 2013-05-19 MED ORDER — CEFTRIAXONE SODIUM 250 MG IJ SOLR
250.0000 mg | Freq: Once | INTRAMUSCULAR | Status: AC
  Administered 2013-05-19: 250 mg via INTRAMUSCULAR
  Filled 2013-05-19: qty 250

## 2013-05-19 NOTE — ED Provider Notes (Signed)
CSN: [id number]     Arrival date & time 05/19/13  1024 History  This chart was scribed for non-physician practitioner Elpidio AnisShari Odilon Cass, PA-C working with Gerhard Munchobert Lockwood, MD by Dorothey Basemania Sutton, ED Scribe. This patient was seen in room TR06C/TR06C and the patient's care was started at 11:00 AM.    Chief Complaint  Patient presents with  . SEXUALLY TRANSMITTED DISEASE   The history is provided by the patient. No language interpreter was used.   HPI Comments: Larry Alvarez is a 28 y.o. male who presents to the Emergency Department complaining of a constant pain to the penis with associated burning dysuria and penile discharge onset 2 days ago. He denies any testicular pain, scrotal swelling, nausea, emesis. He reports a history of prior STD infections. He denies any allergies to medications. Patient has no other pertinent medical history.   No past medical history on file. No past surgical history on file. No family history on file. History  Substance Use Topics  . Smoking status: Current Every Day Smoker    Types: Cigarettes  . Smokeless tobacco: Not on file  . Alcohol Use: 1.2 oz/week    2 Cans of beer per week    Review of Systems  Gastrointestinal: Negative for nausea and vomiting.  Genitourinary: Positive for dysuria, discharge and penile pain. Negative for scrotal swelling and testicular pain.  All other systems reviewed and are negative.   Allergies  Review of patient's allergies indicates no known allergies.  Home Medications   Current Outpatient Rx  Name  Route  Sig  Dispense  Refill  . ibuprofen (ADVIL,MOTRIN) 200 MG tablet   Oral   Take 400 mg by mouth every 6 (six) hours as needed for pain.          Triage Vitals: BP 119/62  Pulse 65  Temp(Src) 97.8 F (36.6 C) (Oral)  Ht 5\' 10"  (1.778 m)  Wt 140 lb (63.504 kg)  BMI 20.09 kg/m2  SpO2 100%  Physical Exam  Nursing note and vitals reviewed. Constitutional: He is oriented to person, place, and time. He appears  well-developed and well-nourished. No distress.  HENT:  Head: Normocephalic and atraumatic.  Eyes: Conjunctivae are normal.  Neck: Normal range of motion. Neck supple.  Pulmonary/Chest: Effort normal. No respiratory distress.  Abdominal: He exhibits no distension.  Genitourinary: Testes normal. Right testis shows no mass, no swelling and no tenderness. Left testis shows no mass, no swelling and no tenderness. No discharge found.  No testicular swelling or tenderness. No testicular masses. No purulent penile discharge. No inguinal lymphadenopathy.   Exam was performed with patient's permission. Chaperone (scribe) was present. The exam was performed with no discomfort or complications.    Musculoskeletal: Normal range of motion.  Lymphadenopathy:       Right: No inguinal adenopathy present.       Left: No inguinal adenopathy present.  Neurological: He is alert and oriented to person, place, and time.  Skin: Skin is warm and dry.  Psychiatric: He has a normal mood and affect. His behavior is normal.    ED Course  Procedures (including critical care time)  DIAGNOSTIC STUDIES: Oxygen Saturation is 100% on room air, normal by my interpretation.    COORDINATION OF CARE: 11:02 AM- Performed STD testing. Will discharge patient with antibiotics to treat. Advised patient to use condoms. Discussed treatment plan with patient at bedside and patient verbalized agreement.     Labs Review Labs Reviewed - No data to display Imaging Review  No results found.  EKG Interpretation   None       MDM   Final diagnoses:  None    1. Urethritis  Uncomplicated urethritis. Cultures pending. Treated with abx and given STD information, safety education.   I personally performed the services described in this documentation, which was scribed in my presence. The recorded information has been reviewed and is accurate.      Arnoldo Hooker, PA-C 05/19/13 1423

## 2013-05-19 NOTE — Discharge Instructions (Signed)
Urethritis, Adult Urethritis is an inflammation of the tube through which urine exits your bladder (urethra).  CAUSES Urethritis is often caused by an infection in your urethra. The infection can be viral, like herpes. The infection can also be bacterial, like gonorrhea. RISK FACTORS Risk factors of urethritis include:  Having sex without using a condom.  Having multiple sexual partners.  Having poor hygiene. SIGNS AND SYMPTOMS Symptoms of urethritis are less noticeable in women than in men. These symptoms include:  Burning feeling when you urinate (dysuria).  Discharge from your urethra.  Blood in your urine (hematuria).  Urinating more than usual. DIAGNOSIS  To confirm a diagnosis of urethritis, your health care provider will do the following:  Ask about your sexual history.  Perform a physical exam.  Have you provide a sample of your urine for lab testing.  Use a cotton swab to gently collect a sample from your urethra for lab testing. TREATMENT  It is important to treat urethritis. Depending on the cause, untreated urethritis may lead to serious genital infections and possibly infertility. Urethritis caused by a bacterial infection is treated with antibiotics. All sexual partners must be treated.  HOME CARE INSTRUCTIONS  Do not have sex until the test results are known and treatment is completed, even if your symptoms go away before you finish treatment.  Finish all medicines that you are prescribed. SEEK MEDICAL CARE IF:   Your symptoms are not improved in 3 days.  Your symptoms are getting worse.  You develop abdominal pain or pelvic pain (in women).  You develop joint pain. SEEK IMMEDIATE MEDICAL CARE IF:   You have a fever with a temperature of 101.41F (38.8C) or greater.  You have severe pain in the belly, back, or side.  You have repeated vomiting. Document Released: 09/19/2000 Document Revised: 01/14/2013 Document Reviewed: 11/24/2012 [facility]  Patient Information 2014 [place], [place].  Sexually Transmitted Disease A sexually transmitted disease (STD) is a disease or infection that may be passed (transmitted) from person to person, usually during sexual activity. This may happen by way of saliva, semen, blood, vaginal mucus, or urine. Common STDs include:   Gonorrhea.   Chlamydia.   Syphilis.   HIV and AIDS.   Genital herpes.   Hepatitis B and C.   Trichomonas.   Human papillomavirus (HPV).   Pubic lice.   Scabies.  Mites.  Bacterial vaginosis. WHAT ARE CAUSES OF STDs? An STD may be caused by bacteria, a virus, or parasites. STDs are often transmitted during sexual activity if one person is infected. However, they may also be transmitted through nonsexual means. STDs may be transmitted after:   Sexual intercourse with an infected person.   Sharing sex toys with an infected person.   Sharing needles with an infected person or using unclean piercing or tattoo needles.  Having intimate contact with the genitals, mouth, or rectal areas of an infected person.   Exposure to infected fluids during birth. WHAT ARE THE SIGNS AND SYMPTOMS OF STDs? Different STDs have different symptoms. Some people may not have any symptoms. If symptoms are present, they may include:   Painful or bloody urination.   Pain in the pelvis, abdomen, vagina, anus, throat, or eyes.   Skin rash, itching, irritation, growths, sores (lesions), ulcerations, or warts in the genital or anal area.  Abnormal vaginal discharge with or without bad odor.   Penile discharge in men.   Fever.   Pain or bleeding during sexual intercourse.   Swollen glands  in the groin area.   Yellow skin and eyes (jaundice). This is seen with hepatitis.   Swollen testicles.  Infertility.  Sores and blisters in the mouth. HOW ARE STDs DIAGNOSED? To make a diagnosis, your health care provider may:   Take a medical history.   Perform  a physical exam.   Take a sample of any discharge for examination.  Swab the throat, cervix, opening to the penis, rectum, or vagina for testing.  Test a sample of your first morning urine.   Perform blood tests.   Perform a Pap smear, if this applies.   Perform a colposcopy.   Perform a laparoscopy.  HOW ARE STDs TREATED? Treatment depends on the STD. Some STDs may be treated but not cured.   Chlamydia, gonorrhea, trichomonas, and syphilis can be cured with antibiotics.   Genital herpes, hepatitis, and HIV can be treated, but not cured, with prescribed medicines. The medicines lessen symptoms.   Genital warts from HPV can be treated with medicine or by freezing, burning (electrocautery), or surgery. Warts may come back.   HPV cannot be cured with medicine or surgery. However, abnormal areas may be removed from the cervix, vagina, or vulva.   If your diagnosis is confirmed, your recent sexual partners need treatment. This is true even if they are symptom-free or have a negative culture or evaluation. They should not have sex until their health care providers say it is OK. HOW CAN I REDUCE MY RISK OF GETTING AN STD?  Use latex condoms, dental dams, and water-soluble lubricants during sexual activity. Do not use petroleum jelly or oils.  Get vaccinated for HPV and hepatitis. If you have not received these vaccines in the past, talk to your health care provider about whether one or both might be right for you.   Avoid risky sex practices that can break the skin.  WHAT SHOULD I DO IF I THINK I HAVE AN STD?  See your health care provider.   Inform all sexual partners. They should be tested and treated for any STDs.  Do not have sex until your health care provider says it is OK. WHEN SHOULD I GET HELP? Seek immediate medical care if:  You develop severe abdominal pain.  You are a man and notice swelling or pain in the testicles.  You are a woman and notice  swelling or pain in your vagina. Document Released: 06/16/2002 Document Revised: 01/14/2013 Document Reviewed: 10/14/2012 [facility] Patient Information 2014 [place], [place]. Safe Sex Safe sex is about reducing the risk of giving or getting a sexually transmitted disease (STD). STDs are spread through sexual contact involving the genitals, mouth, or rectum. Some STDS can be cured and others cannot. Safe sex can also prevent unintended pregnancies.  SAFE SEX PRACTICES  Limit your sexual activity to only one partner who is only having sex with you.  Talk to your partner about their past partners, past STDs, and drug use.  Use a condom every time you have sexual intercourse. This includes vaginal, oral, and anal sexual activity. Both females and males should wear condoms during oral sex. Only use latex or polyurethane condoms and water-based lubricants. Petroleum-based lubricants or oils used to lubricate a condom will weaken the condom and increase the chance that it will break. The condom should be in place from the beginning to the end of sexual activity. Wearing a condom reduces, but does not completely eliminate, your risk of getting or giving a STD. STDs can be spread by  contact with skin of surrounding areas.  Get vaccinated for hepatitis B and HPV.  Avoid alcohol and recreational drugs which can affect your judgement. You may forget to use a condom or participate in high-risk sex.  For females, avoid douching after sexual intercourse. Douching can spread an infection farther into the reproductive tract.  Check your body for signs of sores, blisters, rashes, or unusual discharge. See your caregiver if you notice any of these signs.  Avoid sexual contact if you have symptoms of an infection or are being treated for an STD. If you or your partner has herpes, avoid sexual contact when blisters are present. Use condoms at all other times.  See your caregiver for regular screenings, examinations,  and tests for STDs. Before having sex with a new partner, each of you should be screened for STDs and talk about the results with your partner. BENEFITS OF SAFE SEX   There is less of a chance of getting or giving an STD.  You can prevent unwanted or unintended pregnancies.  By discussing safer sex concerns with your partner, you may increase feelings of intimacy, comfort, trust, and honesty between the both of you. Document Released: 05/03/2004 Document Revised: 12/19/2011 Document Reviewed: 09/17/2011 [facility] Patient Information 2014 [place], [place].

## 2013-05-19 NOTE — ED Notes (Signed)
Patient states started having discharge x 2 days ago.  Patient states burning in penis.

## 2013-05-19 NOTE — ED Provider Notes (Signed)
Medical screening examination/treatment/procedure(s) were performed by non-physician practitioner and as supervising physician I was immediately available for consultation/collaboration.  Kristi Norment, MD 05/19/13 1544 

## 2013-05-20 LAB — GC/CHLAMYDIA PROBE AMP
CT Probe RNA: NEGATIVE
GC PROBE AMP APTIMA: NEGATIVE

## 2013-06-17 ENCOUNTER — Emergency Department (HOSPITAL_COMMUNITY)
Admission: EM | Admit: 2013-06-17 | Discharge: 2013-06-17 | Disposition: A | Discharge status: Home / Self Care | Payer: No Typology Code available for payment source | Attending: Emergency Medicine | Admitting: Emergency Medicine

## 2013-06-17 ENCOUNTER — Encounter (HOSPITAL_COMMUNITY): Payer: Self-pay | Admitting: Emergency Medicine

## 2013-06-17 DIAGNOSIS — R300 Dysuria: Secondary | ICD-10-CM | POA: Insufficient documentation

## 2013-06-17 DIAGNOSIS — R369 Urethral discharge, unspecified: Secondary | ICD-10-CM | POA: Insufficient documentation

## 2013-06-17 DIAGNOSIS — F17200 Nicotine dependence, unspecified, uncomplicated: Secondary | ICD-10-CM | POA: Insufficient documentation

## 2013-06-17 DIAGNOSIS — R599 Enlarged lymph nodes, unspecified: Secondary | ICD-10-CM | POA: Insufficient documentation

## 2013-06-17 MED ORDER — LIDOCAINE HCL (PF) 1 % IJ SOLN
INTRAMUSCULAR | Status: AC
  Administered 2013-06-17: 5 mL
  Filled 2013-06-17: qty 5

## 2013-06-17 MED ORDER — CEFTRIAXONE SODIUM 250 MG IJ SOLR
250.0000 mg | Freq: Once | INTRAMUSCULAR | Status: AC
  Administered 2013-06-17: 250 mg via INTRAMUSCULAR
  Filled 2013-06-17: qty 250

## 2013-06-17 MED ORDER — AZITHROMYCIN 250 MG PO TABS
1000.0000 mg | ORAL_TABLET | Freq: Once | ORAL | Status: AC
  Administered 2013-06-17: 1000 mg via ORAL
  Filled 2013-06-17: qty 4

## 2013-06-17 NOTE — ED Notes (Signed)
Did go over some patient education on how to prevent coming in for STD's.    Patient verbalized understanding.   He advised that he does use protection.

## 2013-06-17 NOTE — ED Notes (Signed)
Pt states he has been having painful penile discharge for about 2 days and painful urination.

## 2013-06-17 NOTE — ED Provider Notes (Signed)
CSN: [id number]     Arrival date & time 06/17/13  1351 History  This chart was scribed for non-physician practitioner, Rudene Anda, PA-C,working with Nelia Shi, MD, by Greggory Stallion, ED Scribe.  This patient was seen in room TR07C/TR07C and the patient's care was started at 4:10 PM.  Chief Complaint  Patient presents with  . SEXUALLY TRANSMITTED DISEASE   The history is provided by the patient. No language interpreter was used.   HPI Comments:  AUDI WETTSTEIN is a 28 y.o. male who presents to the Emergency Department complaining of penile discharge and dysuria that started 2 days ago. Pt thinks he has chlamydia but states his partner has not been diagnosed with anything. Denies rash, fever, chills, abdominal pain, rectal pain, cough sore throat, hematuria, back pain. Pt was diagnosed and treated for chlamydia one month ago.      History reviewed. No pertinent past medical history. History reviewed. No pertinent past surgical history. No family history on file. History  Substance Use Topics  . Smoking status: Current Every Day Smoker    Types: Cigarettes  . Smokeless tobacco: Not on file  . Alcohol Use: 1.2 oz/week    2 Cans of beer per week    Review of Systems  Constitutional: Negative for fever and chills.  HENT: Negative for sore throat.   Respiratory: Negative for cough.   Gastrointestinal: Negative for abdominal pain.  Genitourinary: Negative for hematuria.  Musculoskeletal: Negative for back pain.  Skin: Negative for rash.  All other systems reviewed and are negative.    Allergies  Review of patient's allergies indicates no known allergies.  Home Medications   Current Outpatient Rx  Name  Route  Sig  Dispense  Refill  . ibuprofen (ADVIL,MOTRIN) 200 MG tablet   Oral   Take 200 mg by mouth every 6 (six) hours as needed for headache or moderate pain.           Triage Vitals: BP 110/53  Pulse 67  Temp(Src) 98.4 F (36.9 C) (Oral)  Resp 18   SpO2 98% Physical Exam  Nursing note and vitals reviewed. Constitutional: He is oriented to person, place, and time. He appears well-developed and well-nourished. No distress.  HENT:  Head: Normocephalic and atraumatic.  Eyes: Conjunctivae and EOM are normal.  Neck: Normal range of motion. No tracheal deviation present.  Cardiovascular: Normal rate, regular rhythm and normal heart sounds.   Pulmonary/Chest: Effort normal and breath sounds normal. No respiratory distress. He has no wheezes. He has no rales.  Genitourinary: Testes normal and penis normal. Circumcised. No penile erythema or penile tenderness.  Musculoskeletal: Normal range of motion. He exhibits no edema.  Lymphadenopathy:       Right: Inguinal adenopathy present.       Left: Inguinal adenopathy present.  Tender to palpation inguinal lymph nodes.  Neurological: He is alert and oriented to person, place, and time.  Skin: Skin is warm and dry. No rash noted. He is not diaphoretic.  Psychiatric: He has a normal mood and affect. His behavior is normal.    ED Course  Procedures (including critical care time) DIAGNOSTIC STUDIES: Oxygen Saturation is 98% on RA, normal by my interpretation.   COORDINATION OF CARE: 4:11 PM-Discussed treatment plan which includes STD testing with pt at bedside and pt agreed to plan.   Medications  cefTRIAXone (ROCEPHIN) injection 250 mg (250 mg Intramuscular Given 06/17/13 1638)  azithromycin (ZITHROMAX) tablet 1,000 mg (1,000 mg Oral Given 06/17/13 1637)  lidocaine (PF) (XYLOCAINE) 1 % injection (5 mLs  Given 06/17/13 1638)    Labs Review Labs Reviewed - No data to display Imaging Review No results found.   EKG Interpretation None      MDM   Final diagnoses:  Penile discharge  Dysuria    Patient afebrile, in NAD.  Suspect likely G/C. Will treat empirically. Advised follow up with Health department if he desires further testing. Patient advised to avoid sexual contact until him  and partner both tested and treated adequately.   I personally performed the services described in this documentation, which was scribed in my presence. The recorded information has been reviewed and is accurate.  Meds given in ED:  Medications  cefTRIAXone (ROCEPHIN) injection 250 mg (250 mg Intramuscular Given 06/17/13 1638)  azithromycin (ZITHROMAX) tablet 1,000 mg (1,000 mg Oral Given 06/17/13 1637)  lidocaine (PF) (XYLOCAINE) 1 % injection (5 mLs  Given 06/17/13 1638)    New Prescriptions   No medications on file     Rudene AndaJacob Gray Dajon Lazar, [place] JerseyPA-C 06/18/13 1151

## 2013-06-17 NOTE — Discharge Instructions (Signed)
Avoid sexual contact until you and your partner have both been adequately assessed and treated. Return to Emergency department if you develop any worsening symptoms or bloody urine, fever/chills, back pain, or difficulties urinating. Resource guide provided below for further follow up resources.    Emergency Department Resource Guide 1) Find a Doctor and Pay Out of Pocket Although you won't have to find out who is covered by your insurance plan, it is a good idea to ask around and get recommendations. You will then need to call the office and see if the doctor you have chosen will accept you as a new patient and what types of options they offer for patients who are self-pay. Some doctors offer discounts or will set up payment plans for their patients who do not have insurance, but you will need to ask so you aren't surprised when you get to your appointment.  2) Contact Your Local Health Department Not all health departments have doctors that can see patients for sick visits, but many do, so it is worth a call to see if yours does. If you don't know where your local health department is, you can check in your phone book. The CDC also has a tool to help you locate your state's health department, and many state websites also have listings of all of their local health departments.  3) Find a Walk-in Clinic If your illness is not likely to be very severe or complicated, you may want to try a walk in clinic. These are popping up all over the country in pharmacies, drugstores, and shopping centers. They're usually staffed by nurse practitioners or physician assistants that have been trained to treat common illnesses and complaints. They're usually fairly quick and inexpensive. However, if you have serious medical issues or chronic medical problems, these are probably not your best option.  No Primary Care Doctor: - Call Health Connect at  [phone] - they can help you locate a primary care doctor that   accepts your insurance, provides certain services, etc. - Physician Referral Service- [phone]  Chronic Pain Problems: Organization         Address  Phone   Notes  [facility]  [phone] Patients need to be referred by their primary care doctor.   Medication Assistance: Organization         Address  Phone   Notes  [facility] Medication [facility] [address] [place], [place] [address] [phone] --Must be a resident of [facility] [place] -- Must have NO insurance coverage whatsoever (no Medicaid/ Medicare, etc.) -- The pt. MUST have a primary care doctor that directs their care regularly and follows them in the community   MedAssist  [phone]   [employer]  [phone]    Agencies that provide inexpensive medical care: Organization         Address  Phone   Notes  [facility]  [phone]   [facility]    [phone]) [phone]   [facility] [address] [place], [place] [address] [phone]   [facility] [address], [place] [phone]   Planned Parenthood    [phone]) [phone]   [facility]    [phone]   [facility]  [address], [place] Phone:  [phone], Fax:  [phone] Hours of Operation:  9 am - 6 pm, M-F.  Also accepts Medicaid/Medicare and self-pay.  [facility] for [place], [address], [place] Phone: [phone], Fax: [phone]. Hours of Operation:  8:30 am - 5:30 pm, M-F.  Also accepts Medicaid and self-pay.  [facility] [place] [address], [place] Phone: [phone]   [place], [place], [place] [phone], Ext. [phone] Mondays & Thursdays: 7-9 AM.  First 15 patients are seen on a first come, first serve basis.    [place] Providers:  Organization          Address  Phone   Notes  [facility] [address], [place] [phone] Also accepts self-pay patients.  [facility] [address], [place]  [phone]   [place], [address], [place] [phone]   [facility] [address], [place] [phone]   Lucianne Lei [address], [place]   [phone] Only accepts [place] Access [place] patients after they have their name applied to their card.   Self-Pay (no insurance) in [facility]:  Organization         Address  Phone   Notes  Sickle Cell Patients, [facility] [place] [phone]   [facility] Urgent Care [place] [phone]   [facility] [place]  [place], [place], [place] [phone]   [facility]/Dr. Osei-Bonsu  [address] [place] or [address], [place] [phone] Phone number for both [place] and [place] locations is the same.  Urgent Medical and [facility] [address], [place] [phone]   [facility] [address], [place] or [address] [phone]   [facility] [address] [place] [phone], phone; [phone], fax Sees patients 1st and 3rd Saturday of every month.  Must not qualify for public or private insurance (i.e. Medicaid, Medicare, [place] Health Choice, Veterans' Benefits)  Household income should be no more than 200% of the poverty level The clinic cannot treat you if you are pregnant or think you are pregnant  Sexually transmitted diseases are not treated at the clinic.    Dental Care: Organization         Address  Phone  Notes  [facility] [place] [phone] Accepts children up to age 40 who are enrolled in [place] or [place]; pregnant women with a Medicaid card; and children who have applied for Medicaid or [place] Health Choice, but were declined, whose parents can pay a reduced fee at time of service.  [facility] Department of [facility]  [address], [place] [phone] Accepts children up to age 10 who are enrolled in [place] or [place]; pregnant women with a Medicaid card; and children who have applied for Medicaid or [place] Health Choice, but were declined, whose parents can pay a reduced fee at time of service.  [place] Adult Dental Access PROGRAM  [place] [phone] Patients are seen by appointment only. Walk-ins are not accepted. [place] will see patients 12 years of age and older. Monday - Tuesday (  8am-5pm) Most Wednesdays (8:30-5pm) $30 per visit, cash only  [facility] Adult Dental Access PROGRAM  [address], [facility] [phone] Patients are seen by appointment only. Walk-ins are not accepted. [place] will see patients 35 years of age and older. One Wednesday Evening (Monthly: Volunteer Based).  $30 per visit, cash only  [place]  [phone] for adults; Children under age 38, call Graduate Pediatric Dentistry at [phone]. Children aged 78-14, please call [phone] to request a pediatric application.  Dental services are provided in all areas of dental care including fillings, crowns and bridges, complete and partial dentures, implants, gum treatment, root canals, and extractions. Preventive care is also provided. Treatment is provided to both adults and children. Patients are selected via a lottery and there is often a waiting list.   [facility] [address] [place]  [phone] [email or web address]   [facility] [address] [place], [place]  [phone], Ext. [phone] Second and Fourth Thursday of each month, opens at 6:30 AM; Clinic ends at 9 AM.  Patients are seen on a first-come first-served basis, and a limited number are seen during each clinic.   [facility]  [address] Hillard Danker [place], [place] [phone]   Eligibility Requirements You must have lived in [place], [place], or [place] counties for at least the last three months.   You cannot be eligible for state or federal sponsored [employer], including [employer], [place], or [employer].   You generally cannot be eligible for healthcare insurance through your employer.    How to apply: Eligibility screenings are held every Tuesday and Wednesday afternoon from 1:00 pm until 4:00 pm. You do not need an appointment for the interview!  [facility] [address] [place], [place]   [place]  [place] Department  [place]  [phone]    Behavioral Health Resources in the Community: Intensive Outpatient Programs Organization         Address  Phone  Notes  [place]. [address] [place], [place] [phone]   [facility] [address] [place], [place]   ADS: Alcohol & Drug Svcs [address], [place], [place]   [place] [address]  [place], [place] or [phone]   Substance Abuse Resources Organization         Address  Phone  Notes  Alcohol and Drug Services  [phone]   [place]  [phone]   The [place]   [place]ester  [phone]   Residential & Outpatient Substance Abuse Program  [phone]   Psychological Services Organization         Address  Phone  Notes  [facility] [place]  [place]  [phone]    [place] [address], [place] or [phone]    Mobile Crisis Teams Organization         Address  Phone  Notes  [facility], Mobile Crisis Care Unit  [phone]   [facility]ive Psychotherapeutic Services  [address] [place], [place]   [place] [address] [place] [phone]    Self-Help/Support Groups Organization         Address  Phone  Notes  [facility]ental Health Assoc[facility] of [place] - variety of support groups  [place] Call for more information  Narcotics Anonymous (NA), Caring Services [address], [employer] [place]  2 meetings at this location   Special educational needs teacher         Address  Phone  Notes  [facility]AP Residential Treatment [place],    [place]  [phone]   [facility]  [address], [place] [address]26834, [place], [place]   [place], [place] [phone] Admissions: 8am-3pm M-F  Incentives Substance [place] [address],    [place], [place] [phone]   [facility] [address] [place], [place], [place]   [facility] [address]  [place], [place]   Insight Programs - Intensive Outpatient [address], [place], [place]   [facility] ([place] [place] [address].,  [place], [place] [phone] or [phone]   [facility]idential Treatment Services (RTS) [address] [place], [place] Accepts Medicaid  [facility] [address],  [place] [phone] Substance Abuse/Addiction Treatment   [facility] Organization         Address  Phone  Notes  [facility]  [phone]   Domenic Schwab, PhD [address] Arlis Porta [place], [place]   [phone] or [phone]   [place]  [place], [place] [phone]   [facility] [address], [place], [place] [phone] Insurance/Medicaid/sponsorship through [facility] Families [address], [address] [place]                                    [place], [place] [phone] [place] [address], [place] [phone]    Dr. Adele Schilder  [phone]   [facility] Dept. 1) [address] [place] 2) [place] 3)  [place] [address], [place] [phone]  [phone]   [place] [phone] or [phone] (After Hours)

## 2013-06-18 LAB — GC/CHLAMYDIA PROBE AMP
CT PROBE, AMP APTIMA: NEGATIVE
GC PROBE AMP APTIMA: NEGATIVE

## 2013-06-29 NOTE — ED Provider Notes (Signed)
Medical screening examination/treatment/procedure(s) were performed by non-physician practitioner and as supervising physician I was immediately available for consultation/collaboration.   Amerika Nourse L Fayelynn Distel, MD 06/29/13 1120 

## 2013-08-22 ENCOUNTER — Emergency Department (HOSPITAL_COMMUNITY)
Admission: EM | Admit: 2013-08-22 | Discharge: 2013-08-22 | Disposition: A | Discharge status: Home / Self Care | Payer: No Typology Code available for payment source | Attending: Emergency Medicine | Admitting: Emergency Medicine

## 2013-08-22 ENCOUNTER — Encounter (HOSPITAL_COMMUNITY): Payer: Self-pay | Admitting: Emergency Medicine

## 2013-08-22 DIAGNOSIS — N342 Other urethritis: Secondary | ICD-10-CM | POA: Insufficient documentation

## 2013-08-22 DIAGNOSIS — L0231 Cutaneous abscess of buttock: Secondary | ICD-10-CM

## 2013-08-22 DIAGNOSIS — F17200 Nicotine dependence, unspecified, uncomplicated: Secondary | ICD-10-CM | POA: Insufficient documentation

## 2013-08-22 DIAGNOSIS — L03317 Cellulitis of buttock: Secondary | ICD-10-CM

## 2013-08-22 MED ORDER — AZITHROMYCIN 250 MG PO TABS
1000.0000 mg | ORAL_TABLET | Freq: Once | ORAL | Status: AC
  Administered 2013-08-22: 1000 mg via ORAL
  Filled 2013-08-22: qty 4

## 2013-08-22 MED ORDER — CEFTRIAXONE SODIUM 250 MG IJ SOLR
250.0000 mg | Freq: Once | INTRAMUSCULAR | Status: AC
  Administered 2013-08-22: 250 mg via INTRAMUSCULAR
  Filled 2013-08-22: qty 250

## 2013-08-22 MED ORDER — LIDOCAINE HCL 1 % IJ SOLN
INTRAMUSCULAR | Status: AC
  Administered 2013-08-22: 10 mL
  Filled 2013-08-22: qty 20

## 2013-08-22 NOTE — Discharge Instructions (Signed)
Please read and follow all provided instructions.  Your diagnoses today include:  1. Urethritis   2. Abscess of buttock, left    Tests performed today include:  Test for gonorrhea and chlamydia. You will be notified by telephone if you have a positive result.  Vital signs. See below for your results today.   Medications:  You were treated for chlamydia (1 gram azithromycin pills) and gonorrhea (250mg  rocephin shot).  Home care instructions:  Read educational materials contained in this packet and follow any instructions provided.   You should tell your partners about your infection and avoid having sex for one week to allow time for the medicine to work.  Follow-up instructions: You should follow-up with the [facility] to be tested for HIV, syphilis, and hepatitis -- all of which can be transmitted by sexual contact.   STD Testing:  [facility] Department of [facility] [place], [place] [facility]  [address], [place], phone [phone] or [phone]    Monday - Friday, call for an appointment  [facility] of [facility] [place], [place] [facility]  [address], [place], phone [phone] or [phone]   Monday - Friday, call for an appointment   If you do not have a primary care doctor -- see below for referral information.   Return instructions:   Please return to the Emergency Department if you experience worsening symptoms.   Please return if you have any other emergent concerns.  Additional Information:  Your vital signs today were: BP 125/61   Pulse 86   Temp(Src) 98.3 F (36.8 C) (Oral)   Resp 18   SpO2 100% If your blood pressure (BP) was elevated above 135/85 this visit, please have this repeated by your doctor within one month. --------------  Please read and follow all provided instructions.  Your diagnoses today include:  1. Urethritis   2. Abscess of buttock, left      Tests performed today  include:  Vital signs. See below for your results today.   Wound culture - to determine what antibiotics work on your infection  Home care instructions:   Follow any educational materials contained in this packet  Follow-up instructions: Return to the Emergency Department in 48 hours for a recheck if your symptoms are not significantly improved.  Soak area in warm water twice a day.   Please follow-up with your primary care provider in the next 1 week for further evaluation of your symptoms. If you do not have a primary care doctor -- see below for referral information.   Return instructions:  Return to the Emergency Department if you have:  Fever  Worsening symptoms  Worsening pain  Worsening swelling  Redness of the skin that moves away from the affected area, especially if it streaks away from the affected area   Any other emergent concerns

## 2013-08-22 NOTE — ED Provider Notes (Signed)
  Medical screening examination/treatment/procedure(s) were performed by non-physician practitioner and as supervising physician I was immediately available for consultation/collaboration.   EKG Interpretation None         Gerhard Munchobert Jayleigh Notarianni, MD 08/23/13 0000

## 2013-08-22 NOTE — ED Notes (Signed)
Pt c/o possible insect bite on rt butt cheek x 2 days.  Also c/o milky, white penile discharge x 2 days.

## 2013-08-22 NOTE — ED Provider Notes (Signed)
CSN: [id number]     Arrival date & time 08/22/13  1656 History  This chart was scribed for non-physician practitioner, Rhea BleacherJosh Morayma [place], PA-C working with Gerhard Munchobert [place], MD by Luisa DagoPriscilla Tutu, ED scribe. This patient was seen in room WTR5/WTR5 and the patient's care was started at 5:15 PM.    Chief Complaint  Patient presents with  . Insect Bite  . Penile Discharge   The history is provided by the patient. No language interpreter was used.   HPI Comments: Larry Alvarez is a 28 y.o. male who presents to the Emergency Department complaining of a possible STD exposure. He states that he recently had unprotected sex with a new partner. He currently complaining of associated penile discharge. Pt deferred HIV and RPR testing. He states that [facility].  Pt is also complaining of spider bite on his buttock that occurred 2 days ago. Pt denies any prior episodes. He denies any drainage. No fever, N/V. No pain with BM.    History reviewed. No pertinent past medical history. No past surgical history on file. No family history on file. History  Substance Use Topics  . Smoking status: Current Every Day Smoker    Types: Cigarettes  . Smokeless tobacco: Not on file  . Alcohol Use: 1.2 oz/week    2 Cans of beer per week    Review of Systems  Constitutional: Negative for fever.  HENT: Negative for sore throat.   Eyes: Negative for discharge.  Gastrointestinal: Negative for nausea, vomiting and rectal pain.  Genitourinary: Positive for dysuria and discharge. Negative for frequency, genital sores, penile pain and testicular pain.  Musculoskeletal: Negative for arthralgias.  Skin: Positive for wound (insect bite). Negative for color change and rash.       Positive for abscess  Hematological: Negative for adenopathy.   Allergies  Review of patient's allergies indicates no known allergies.  Home Medications   Prior to Admission medications   Medication Sig  Start Date End Date Taking? Authorizing Provider  ibuprofen (ADVIL,MOTRIN) 200 MG tablet Take 200 mg by mouth every 6 (six) hours as needed for headache or moderate pain.     Historical Provider, MD   BP 123/64  Pulse 93  Temp(Src) 98.1 F (36.7 C) (Oral)  Resp 18  SpO2 100%  Physical Exam  Nursing note and vitals reviewed. Constitutional: He is oriented to person, place, and time. He appears well-developed and well-nourished.  HENT:  Head: Normocephalic and atraumatic.  Mouth/Throat: Oropharynx is clear and moist.  Eyes: Conjunctivae are normal. Right eye exhibits no discharge. Left eye exhibits no discharge.  Neck: Normal range of motion. Neck supple.  Cardiovascular: Normal rate, regular rhythm and normal heart sounds.   Pulmonary/Chest: Effort normal and breath sounds normal.  Abdominal: Soft. He exhibits no distension. There is no tenderness.  Genitourinary: Testes normal. Circumcised. No penile tenderness. No discharge found.  No external lesions on genitalia.  Neurological: He is alert and oriented to person, place, and time.  Skin: Skin is warm and dry.  2 cm area of erythema and circular induration to the middle of left buttocks consistent with abscess or infected sebaceous cyst. No significant surrounding cellulitis.  Psychiatric: He has a normal mood and affect.    ED Course  Procedures (including critical care time)  DIAGNOSTIC STUDIES: Oxygen Saturation is 100% on RA, normal by my interpretation.    COORDINATION OF CARE: 5:22 PM- Pt advised of plan for treatment and  pt agrees.  Labs Review Labs Reviewed  GC/CHLAMYDIA PROBE AMP    Imaging Review No results found.   EKG Interpretation None      Patient seen and examined. Work-up initiated. Medications ordered.   Vital signs reviewed and are as follows: Filed Vitals:   08/22/13 1802  BP: 125/61  Pulse: 86  Temp: 98.3 F (36.8 C)  Resp: 18   INCISION AND DRAINAGE Performed by: Renne CriglerJoshua  Hurshel Bouillon Consent: Verbal consent obtained. Risks and benefits: risks, benefits and alternatives were discussed Type: abscess  Body area: left buttock  Anesthesia: local infiltration  Incision was made with a scalpel.  Local anesthetic: lidocaine 2% with epinephrine  Anesthetic total: 2 ml  Complexity: complex Blunt dissection to break up loculations  Drainage: purulent  Drainage amount: moderate  Packing material: none  Patient tolerance: Patient tolerated the procedure well with no immediate complications.  The patient was urged to return to the Emergency Department urgently with worsening pain, swelling, expanding erythema especially if it streaks away from the affected area, fever, or if they have any other concerns.   The patient was urged to return to the Emergency Department or go to their PCP in 48 hours for wound recheck if the area is not significantly improved.  The patient verbalized understanding and stated agreement with this plan.    Will test and treat for STD exposure. Patient counseled on safe sexual practices. Told them that they should not have sexual contact for next 7 days and that they need to inform sexual partners so that they can get tested and treated as well. Urged f/u with [place] [facility] for HIV and syphilis testing.  Patient verbalizes understanding and agrees with plan.       MDM   Final diagnoses:  Urethritis  Abscess of buttock, left   Urethritis: Tested and treated for GC/chlamydia.   Abscess: Patient with skin abscess amenable to incision and drainage. No signs of cellulitis is surrounding skin. Will d/c to home. No antibiotic therapy is indicated.  I personally performed the services described in this documentation, which was scribed in my presence. The recorded information has been reviewed and is accurate.    Renne CriglerJoshua Yaniv Lage, PA-C 08/22/13 [phone]

## 2013-08-24 LAB — GC/CHLAMYDIA PROBE AMP
CT Probe RNA: NEGATIVE
GC Probe RNA: NEGATIVE

## 2014-01-03 ENCOUNTER — Emergency Department (HOSPITAL_COMMUNITY)
Admission: EM | Admit: 2014-01-03 | Discharge: 2014-01-03 | Disposition: A | Discharge status: Home / Self Care | Payer: No Typology Code available for payment source | Attending: Emergency Medicine | Admitting: Emergency Medicine

## 2014-01-03 ENCOUNTER — Encounter (HOSPITAL_COMMUNITY): Payer: Self-pay | Admitting: Emergency Medicine

## 2014-01-03 DIAGNOSIS — B349 Viral infection, unspecified: Secondary | ICD-10-CM

## 2014-01-03 DIAGNOSIS — F17200 Nicotine dependence, unspecified, uncomplicated: Secondary | ICD-10-CM | POA: Insufficient documentation

## 2014-01-03 DIAGNOSIS — R369 Urethral discharge, unspecified: Secondary | ICD-10-CM | POA: Insufficient documentation

## 2014-01-03 DIAGNOSIS — Z113 Encounter for screening for infections with a predominantly sexual mode of transmission: Secondary | ICD-10-CM

## 2014-01-03 DIAGNOSIS — B9789 Other viral agents as the cause of diseases classified elsewhere: Secondary | ICD-10-CM | POA: Insufficient documentation

## 2014-01-03 LAB — URINALYSIS, ROUTINE W REFLEX MICROSCOPIC
BILIRUBIN URINE: NEGATIVE
GLUCOSE, UA: NEGATIVE mg/dL
HGB URINE DIPSTICK: NEGATIVE
KETONES UR: NEGATIVE mg/dL
Leukocytes, UA: NEGATIVE
NITRITE: NEGATIVE
Protein, ur: NEGATIVE mg/dL
Specific Gravity, Urine: 1.022 (ref 1.005–1.030)
Urobilinogen, UA: 2 mg/dL — ABNORMAL HIGH (ref 0.0–1.0)
pH: 8 (ref 5.0–8.0)

## 2014-01-03 MED ORDER — LIDOCAINE HCL 1 % IJ SOLN
INTRAMUSCULAR | Status: AC
  Administered 2014-01-03: 0.9 mL
  Filled 2014-01-03: qty 20

## 2014-01-03 MED ORDER — AZITHROMYCIN 1 G PO PACK
1.0000 g | PACK | Freq: Once | ORAL | Status: AC
  Administered 2014-01-03: 1 g via ORAL
  Filled 2014-01-03: qty 1

## 2014-01-03 MED ORDER — CEFTRIAXONE SODIUM 250 MG IJ SOLR
250.0000 mg | Freq: Once | INTRAMUSCULAR | Status: AC
  Administered 2014-01-03: 250 mg via INTRAMUSCULAR
  Filled 2014-01-03: qty 250

## 2014-01-03 NOTE — Discharge Instructions (Signed)
Stay hydrated.   You should get flu shot this year.   You will be called if GC/chlamydia is positive then your partner will need to be checked and treated.   Follow up with your doctor.   Return to ER if you have fever, worse cough, worse penile discharge.

## 2014-01-03 NOTE — ED Provider Notes (Signed)
CSN: [id number]     Arrival date & time 01/03/14  7846 History   First MD Initiated Contact with Patient 01/03/14 1021     Chief Complaint  Patient presents with  . Penile Discharge  . Cough     (Consider location/radiation/quality/duration/timing/severity/associated sxs/prior Treatment) The history is provided by the patient.  Larry Alvarez is a 28 y.o. male hx of GC/chlamydia here with viral syndrome, penile discharge. Cough and congestion for several days. Nonproductive cough. No fevers. No abdominal pain or vomiting. Has some whitish penile discharge as well. No dysuria. Has hx of STDs and this is similar to previous. Has one male partner.    History reviewed. No pertinent past medical history. History reviewed. No pertinent past surgical history. No family history on file. History  Substance Use Topics  . Smoking status: Current Every Day Smoker    Types: Cigarettes  . Smokeless tobacco: Not on file  . Alcohol Use: 1.2 oz/week    2 Cans of beer per week    Review of Systems  Respiratory: Positive for cough.   Genitourinary: Positive for discharge.  All other systems reviewed and are negative.     Allergies  Review of patient's allergies indicates no known allergies.  Home Medications   Prior to Admission medications   Medication Sig Start Date End Date Taking? Authorizing Provider  ibuprofen (ADVIL,MOTRIN) 200 MG tablet Take 200 mg by mouth every 6 (six) hours as needed for headache or moderate pain.     Historical Provider, MD   BP 128/62  Pulse 59  Temp(Src) 98.5 F (36.9 C) (Oral)  Resp 17  SpO2 100% Physical Exam  Nursing note and vitals reviewed. Constitutional: He is oriented to person, place, and time. He appears well-developed and well-nourished.  HENT:  Head: Normocephalic.  Mouth/Throat: Oropharynx is clear and moist.  Eyes: Conjunctivae and EOM are normal. Pupils are equal, round, and reactive to light.  Neck: Normal range of motion.  Neck supple.  Cardiovascular: Normal rate, regular rhythm and normal heart sounds.   Pulmonary/Chest: Effort normal and breath sounds normal. No respiratory distress. He has no wheezes. He has no rales.  Abdominal: Soft. Bowel sounds are normal. He exhibits no distension. There is no tenderness. There is no rebound and no guarding.  Musculoskeletal: Normal range of motion. He exhibits no edema and no tenderness.  Neurological: He is alert and oriented to person, place, and time. No cranial nerve deficit. Coordination normal.  Skin: Skin is warm and dry.  Psychiatric: He has a normal mood and affect. His behavior is normal. Judgment and thought content normal.    ED Course  Procedures (including critical care time) Labs Review Labs Reviewed  URINALYSIS, ROUTINE W REFLEX MICROSCOPIC - Abnormal; Notable for the following:    Urobilinogen, UA 2.0 (*)    All other components within normal limits  GC/CHLAMYDIA PROBE AMP    Imaging Review No results found.   EKG Interpretation None      MDM   Final diagnoses:  None    Larry Alvarez is a 28 y.o. male here with cough, congestion, penile discharge. Lungs clear, afebrile. Likely viral syndrome and I will hold off on cxr. Will swab for GC/chlamydia. Will treat with ceftriaxone, azithro empirically.    Richardean Canal, MD 01/03/14 1122

## 2014-01-03 NOTE — ED Notes (Signed)
Pt c/o penile discharge and burning since Friday. Pt also c/o cough and congestion.

## 2014-01-04 LAB — GC/CHLAMYDIA PROBE AMP
CT Probe RNA: NEGATIVE
GC PROBE AMP APTIMA: NEGATIVE

## 2014-08-05 ENCOUNTER — Emergency Department (HOSPITAL_COMMUNITY)
Admission: EM | Admit: 2014-08-05 | Discharge: 2014-08-05 | Disposition: A | Discharge status: Home / Self Care | Payer: No Typology Code available for payment source | Attending: Emergency Medicine | Admitting: Emergency Medicine

## 2014-08-05 ENCOUNTER — Encounter (HOSPITAL_COMMUNITY): Payer: Self-pay | Admitting: Emergency Medicine

## 2014-08-05 DIAGNOSIS — R369 Urethral discharge, unspecified: Secondary | ICD-10-CM

## 2014-08-05 DIAGNOSIS — Z720 Tobacco use: Secondary | ICD-10-CM | POA: Diagnosis not present

## 2014-08-05 DIAGNOSIS — N485 Ulcer of penis: Secondary | ICD-10-CM | POA: Diagnosis not present

## 2014-08-05 DIAGNOSIS — A630 Anogenital (venereal) warts: Secondary | ICD-10-CM | POA: Diagnosis not present

## 2014-08-05 DIAGNOSIS — Z202 Contact with and (suspected) exposure to infections with a predominantly sexual mode of transmission: Secondary | ICD-10-CM | POA: Diagnosis present

## 2014-08-05 LAB — URINALYSIS, ROUTINE W REFLEX MICROSCOPIC
BILIRUBIN URINE: NEGATIVE
GLUCOSE, UA: NEGATIVE mg/dL
Ketones, ur: NEGATIVE mg/dL
Leukocytes, UA: NEGATIVE
NITRITE: NEGATIVE
Protein, ur: NEGATIVE mg/dL
Specific Gravity, Urine: 1.015 (ref 1.005–1.030)
Urobilinogen, UA: 1 mg/dL (ref 0.0–1.0)
pH: 7 (ref 5.0–8.0)

## 2014-08-05 LAB — URINE MICROSCOPIC-ADD ON

## 2014-08-05 MED ORDER — AZITHROMYCIN 250 MG PO TABS
1000.0000 mg | ORAL_TABLET | Freq: Once | ORAL | Status: AC
  Administered 2014-08-05: 1000 mg via ORAL
  Filled 2014-08-05: qty 4

## 2014-08-05 MED ORDER — CEFTRIAXONE SODIUM 250 MG IJ SOLR
250.0000 mg | Freq: Once | INTRAMUSCULAR | Status: AC
  Administered 2014-08-05: 250 mg via INTRAMUSCULAR
  Filled 2014-08-05: qty 250

## 2014-08-05 NOTE — ED Notes (Signed)
Chaperoned examination and samples taken from patient with PA.

## 2014-08-05 NOTE — Discharge Instructions (Signed)
Genital Warts Genital warts are a sexually transmitted infection. They may appear as small bumps on the tissues of the genital area. CAUSES  Genital warts are caused by a virus called human papillomavirus (HPV). HPV is the most common sexually transmitted disease (STD) and infection of the sex organs. This infection is spread by having unprotected sex with an infected person. It can be spread by vaginal, anal, and oral sex. Many people do not know they are infected. They may be infected for years without problems. However, even if they do not have problems, they can unknowingly pass the infection to their sexual partners. SYMPTOMS   Itching and irritation in the genital area.  Warts that bleed.  Painful sexual intercourse. DIAGNOSIS  Warts are usually recognized with the naked eye on the vagina, vulva, perineum, anus, and rectum. Certain tests can also diagnose genital warts, such as:  A Pap test.  A tissue sample (biopsy) exam.  Colposcopy. A magnifying tool is used to examine the vagina and cervix. The HPV cells will change color when certain solutions are used. TREATMENT  Warts can be removed by:  Applying certain chemicals, such as cantharidin or podophyllin.  Liquid nitrogen freezing (cryotherapy).  Immunotherapy with Candida or Trichophyton injections.  Laser treatment.  Burning with an electrified probe (electrocautery).  Interferon injections.  Surgery. PREVENTION  HPV vaccination can help prevent HPV infections that cause genital warts and that cause cancer of the cervix. It is recommended that the vaccination be given to people between the ages 589 to 27 years old. The vaccine might not work as well or might not work at all if you already have HPV. It should not be given to pregnant women. HOME CARE INSTRUCTIONS   It is important to follow your caregiver's instructions. The warts will not go away without treatment. Repeat treatments are often needed to get rid of warts.  Even after it appears that the warts are gone, the normal tissue underneath often remains infected.  Do not try to treat genital warts with medicine used to treat hand warts. This type of medicine is strong and can burn the skin in the genital area, causing more damage.  Tell your past and current sexual partner(s) that you have genital warts. They may be infected also and need treatment.  Avoid sexual contact while being treated.  Do not touch or scratch the warts. The infection may spread to other parts of your body.  Women with genital warts should have a cervical cancer check (Pap test) at least once a year. This type of cancer is slow-growing and can be cured if found early. Chances of developing cervical cancer are increased with HPV.  Inform your obstetrician about your warts in the event of pregnancy. This virus can be passed to the baby's respiratory tract. Discuss this with your caregiver.  Use a condom during sexual intercourse. Following treatment, the use of condoms will help prevent reinfection.  Ask your caregiver about using over-the-counter anti-itch creams. SEEK MEDICAL CARE IF:   Your treated skin becomes red, swollen, or painful.  You have a fever.  You feel generally ill.  You feel little lumps in and around your genital area.  You are bleeding or have painful sexual intercourse. MAKE SURE YOU:   Understand these instructions.  Will watch your condition.  Will get help right away if you are not doing well or get worse. Document Released: 03/23/2000 Document Revised: 08/10/2013 Document Reviewed: 10/02/2010 [facility] Patient Information 2015 [place], [place]. This  information is not intended to replace advice given to you by your health care provider. Make sure you discuss any questions you have with your health care provider. Safe Sex Safe sex is about reducing the risk of giving or getting a sexually transmitted disease (STD). STDs are spread through sexual  contact involving the genitals, mouth, or rectum. Some STDs can be cured and others cannot. Safe sex can also prevent unintended pregnancies.  WHAT ARE SOME SAFE SEX PRACTICES?  Limit your sexual activity to only one partner who is having sex with only you.  Talk to your partner about his or her past partners, past STDs, and drug use.  Use a condom every time you have sexual intercourse. This includes vaginal, oral, and anal sexual activity. Both females and males should wear condoms during oral sex. Only use latex or polyurethane condoms and water-based lubricants. Using petroleum-based lubricants or oils to lubricate a condom will weaken the condom and increase the chance that it will break. The condom should be in place from the beginning to the end of sexual activity. Wearing a condom reduces, but does not completely eliminate, your risk of getting or giving an STD. STDs can be spread by contact with infected body fluids and skin.  Get vaccinated for hepatitis B and HPV.  Avoid alcohol and recreational drugs, which can affect your judgment. You may forget to use a condom or participate in high-risk sex.  For females, avoid douching after sexual intercourse. Douching can spread an infection farther into the reproductive tract.  Check your body for signs of sores, blisters, rashes, or unusual discharge. See your health care provider if you notice any of these signs.  Avoid sexual contact if you have symptoms of an infection or are being treated for an STD. If you or your partner has herpes, avoid sexual contact when blisters are present. Use condoms at all other times.  If you are at risk of being infected with HIV, it is recommended that you take a prescription medicine daily to prevent HIV infection. This is called pre-exposure prophylaxis (PrEP). You are considered at risk if:  You are a man who has sex with other men (MSM).  You are a heterosexual man or woman who is sexually active with  more than one partner.  You take drugs by injection.  You are sexually active with a partner who has HIV.  Talk with your health care provider about whether you are at high risk of being infected with HIV. If you choose to begin PrEP, you should first be tested for HIV. You should then be tested every 3 months for as long as you are taking PrEP.  See your health care provider for regular screenings, exams, and tests for other STDs. Before having sex with a new partner, each of you should be screened for STDs and should talk about the results with each other. WHAT ARE THE BENEFITS OF SAFE SEX?   There is less chance of getting or giving an STD.  You can prevent unwanted or unintended pregnancies.  By discussing safe sex concerns with your partner, you may increase feelings of intimacy, comfort, trust, and honesty between the two of you. Document Released: 05/03/2004 Document Revised: 08/10/2013 Document Reviewed: 09/17/2011 [facility] Patient Information 2015 [place], [place]. This information is not intended to replace advice given to you by your health care provider. Make sure you discuss any questions you have with your health care provider. Urethritis Urethritis is an inflammation of the  tube through which urine exits your bladder (urethra).  CAUSES Urethritis is often caused by an infection in your urethra. The infection can be viral, like herpes. The infection can also be bacterial, like gonorrhea. RISK FACTORS Risk factors of urethritis include:  Having sex without using a condom.  Having multiple sexual partners.  Having poor hygiene. SIGNS AND SYMPTOMS Symptoms of urethritis are less noticeable in women than in men. These symptoms include:  Burning feeling when you urinate (dysuria).  Discharge from your urethra.  Blood in your urine (hematuria).  Urinating more than usual. DIAGNOSIS  To confirm a diagnosis of urethritis, your health care provider will do the  following:  Ask about your sexual history.  Perform a physical exam.  Have you provide a sample of your urine for lab testing.  Use a cotton swab to gently collect a sample from your urethra for lab testing. TREATMENT  It is important to treat urethritis. Depending on the cause, untreated urethritis may lead to serious genital infections and possibly infertility. Urethritis caused by a bacterial infection is treated with antibiotic medicine. All sexual partners must be treated.  HOME CARE INSTRUCTIONS  Do not have sex until the test results are known and treatment is completed, even if your symptoms go away before you finish treatment.  If you were prescribed an antibiotic, finish it all even if you start to feel better. SEEK MEDICAL CARE IF:   Your symptoms are not improved in 3 days.  Your symptoms are getting worse.  You develop abdominal pain or pelvic pain (in women).  You develop joint pain.  You have a fever. SEEK IMMEDIATE MEDICAL CARE IF:   You have severe pain in the belly, back, or side.  You have repeated vomiting. MAKE SURE YOU:  Understand these instructions.  Will watch your condition.  Will get help right away if you are not doing well or get worse. Document Released: 09/19/2000 Document Revised: 08/10/2013 Document Reviewed: 11/24/2012 [facility] Patient Information 2015 [place]Exit[place], [place]. This information is not intended to replace advice given to you by your health care provider. Make sure you discuss any questions you have with your health care provider.

## 2014-08-05 NOTE — ED Notes (Signed)
Patient states clear discharge from penis.  Patient states burning in penis and when he urinates.   Patient denies other symptoms.

## 2014-08-05 NOTE — ED Provider Notes (Signed)
CSN: [id number]     Arrival date & time 08/05/14  1102 History  This chart was scribed for non-physician practitioner, Langston MaskerKaren Sofia, PA-C, working with Rolan BuccoMelanie Belfi, MD, by Ronney LionSuzanne Le, ED Scribe. This patient was seen in room TR06C/TR06C and the patient's care was started at 11:35 AM.    Chief Complaint  Patient presents with  . SEXUALLY TRANSMITTED DISEASE   Patient is a 29 y.o. male presenting with penile discharge. The history is provided by the patient. No language interpreter was used.  Penile Discharge This is a new problem. The current episode started more than 2 days ago. The problem occurs constantly. The problem has been gradually worsening. Pertinent negatives include no chest pain, no abdominal pain, no headaches and no shortness of breath. Nothing aggravates the symptoms. Nothing relieves the symptoms. He has tried nothing for the symptoms.    HPI Comments: Larry Alvarez is a 29 y.o. male who presents to the Emergency Department complaining of constant, clear penile discharge. He endorses having associated genital sores and burning dysuria. He states his sexual partner has not been tested for anything positive. He denies fever or chills.  Patient has NKDA.   History reviewed. No pertinent past medical history. History reviewed. No pertinent past surgical history. No family history on file. History  Substance Use Topics  . Smoking status: Current Every Day Smoker    Types: Cigarettes  . Smokeless tobacco: Not on file  . Alcohol Use: 1.2 oz/week    2 Cans of beer per week    Review of Systems  Respiratory: Negative for shortness of breath.   Cardiovascular: Negative for chest pain.  Gastrointestinal: Negative for abdominal pain.  Genitourinary: Positive for discharge.  Neurological: Negative for headaches.  All other systems reviewed and are negative.     Allergies  Review of patient's allergies indicates no known allergies.  Home Medications   Prior to  Admission medications   Medication Sig Start Date End Date Taking? Authorizing Provider  ibuprofen (ADVIL,MOTRIN) 200 MG tablet Take 200 mg by mouth every 6 (six) hours as needed for headache or moderate pain.     Historical Provider, MD   BP 128/75 mmHg  Pulse 70  Temp(Src) 98.4 F (36.9 C) (Oral)  Resp 20  Wt 177 lb 6 oz (80.457 kg)  SpO2 99% Physical Exam  Constitutional: He is oriented to person, place, and time. He appears well-developed and well-nourished. No distress.  HENT:  Head: Normocephalic and atraumatic.  Eyes: Conjunctivae and EOM are normal.  Neck: Neck supple. No tracheal deviation present.  Cardiovascular: Normal rate.   Pulmonary/Chest: Effort normal. No respiratory distress.  Genitourinary: Discharge found.  Genital warts. Ulcerated area that is non-tender to the right side of penis. Scant, yellow penile discharge. Chaperone was present during the exam.  Musculoskeletal: Normal range of motion.  Neurological: He is alert and oriented to person, place, and time.  Skin: Skin is warm and dry.  Psychiatric: He has a normal mood and affect. His behavior is normal.  Nursing note and vitals reviewed.   ED Course  Procedures (including critical care time)  DIAGNOSTIC STUDIES: Oxygen Saturation is 99% on RA, normal by my interpretation.    COORDINATION OF CARE: 11:37 AM - Discussed treatment plan with pt at bedside which includes swabbing penis, and pt agreed to plan.   Labs Review Labs Reviewed  URINALYSIS, ROUTINE W REFLEX MICROSCOPIC    MDM   Final diagnoses:  Penile discharge  Genital warts  Penile ulcer   Rocephin and zithromax   I personally performed the services in this documentation, which was scribed in my presence.  The recorded information has been reviewed and considered.   Barnet Pall.    Lonia Skinner [place], PA-C 08/05/14 1422  Rolan Bucco, MD 08/05/14 [phone]

## 2014-08-06 LAB — RPR: RPR Ser Ql: NONREACTIVE

## 2014-08-06 LAB — GC/CHLAMYDIA PROBE AMP (~~LOC~~) NOT AT ARMC
CHLAMYDIA, DNA PROBE: NEGATIVE
Neisseria Gonorrhea: NEGATIVE

## 2014-08-06 LAB — HIV ANTIBODY (ROUTINE TESTING W REFLEX): HIV SCREEN 4TH GENERATION: NONREACTIVE

## 2014-08-09 LAB — HERPES SIMPLEX VIRUS CULTURE
Culture: NOT DETECTED
Special Requests: NORMAL

## 2014-12-08 ENCOUNTER — Emergency Department (HOSPITAL_COMMUNITY)
Admission: EM | Admit: 2014-12-08 | Discharge: 2014-12-08 | Disposition: A | Discharge status: Home / Self Care | Payer: No Typology Code available for payment source | Attending: Emergency Medicine | Admitting: Emergency Medicine

## 2014-12-08 ENCOUNTER — Encounter (HOSPITAL_COMMUNITY): Payer: Self-pay | Admitting: *Deleted

## 2014-12-08 DIAGNOSIS — N342 Other urethritis: Secondary | ICD-10-CM | POA: Insufficient documentation

## 2014-12-08 DIAGNOSIS — Z720 Tobacco use: Secondary | ICD-10-CM | POA: Insufficient documentation

## 2014-12-08 LAB — URINALYSIS, ROUTINE W REFLEX MICROSCOPIC
Bilirubin Urine: NEGATIVE
Glucose, UA: NEGATIVE mg/dL
Hgb urine dipstick: NEGATIVE
Ketones, ur: NEGATIVE mg/dL
NITRITE: NEGATIVE
PH: 7 (ref 5.0–8.0)
Protein, ur: NEGATIVE mg/dL
Specific Gravity, Urine: 1.013 (ref 1.005–1.030)
Urobilinogen, UA: 0.2 mg/dL (ref 0.0–1.0)

## 2014-12-08 LAB — URINE MICROSCOPIC-ADD ON

## 2014-12-08 MED ORDER — CEFTRIAXONE SODIUM 250 MG IJ SOLR
250.0000 mg | Freq: Once | INTRAMUSCULAR | Status: AC
  Administered 2014-12-08: 250 mg via INTRAMUSCULAR
  Filled 2014-12-08: qty 250

## 2014-12-08 MED ORDER — LIDOCAINE HCL (PF) 1 % IJ SOLN
5.0000 mL | Freq: Once | INTRAMUSCULAR | Status: AC
  Administered 2014-12-08: 5 mL
  Filled 2014-12-08: qty 5

## 2014-12-08 MED ORDER — CIPROFLOXACIN HCL 500 MG PO TABS: 500.0000 mg | ORAL_TABLET | Freq: Two times a day (BID) | ORAL | Status: DC

## 2014-12-08 MED ORDER — AZITHROMYCIN 250 MG PO TABS
1000.0000 mg | ORAL_TABLET | Freq: Once | ORAL | Status: AC
  Administered 2014-12-08: 1000 mg via ORAL
  Filled 2014-12-08: qty 4

## 2014-12-08 NOTE — ED Notes (Signed)
Pt reports  Pelvic pain and discomfort ,burning during voiding.

## 2014-12-08 NOTE — ED Provider Notes (Signed)
CSN: [id number]     Arrival date & time 12/08/14  1144 History  This chart was scribed for non-physician practitioner, Teressa Lower, NP, working with Elwin Mocha, MD by Charline Bills, ED Scribe. This patient was seen in room TR08C/TR08C and the patient's care was started at 11:50 AM.   Chief Complaint  Patient presents with  . Exposure to STD   The history is provided by the patient. No language interpreter was used.   HPI Comments: Larry Alvarez is a 29 y.o. male who presents to the Emergency Department complaining of exposure to STD. Pt reports persistent left lower abdominal pain for the past few days. No aggravating or alleviating factors. He also reports associated dysuria. Pt denies fever and penile discharge. He reports that he uses protection but states "stuff happens sometimes". He also reports h/o STD; last STD was 5-6 months ago.   No past medical history on file. No past surgical history on file. No family history on file. Social History  Substance Use Topics  . Smoking status: Current Every Day Smoker    Types: Cigarettes  . Smokeless tobacco: Not on file  . Alcohol Use: 1.2 oz/week    2 Cans of beer per week    Review of Systems  Constitutional: Negative for fever.  Genitourinary: Positive for dysuria. Negative for discharge.       + Pelvic pain  All other systems reviewed and are negative.  Allergies  Review of patient's allergies indicates no known allergies.  Home Medications   Prior to Admission medications   Medication Sig Start Date End Date Taking? Authorizing Provider  ibuprofen (ADVIL,MOTRIN) 200 MG tablet Take 200 mg by mouth every 6 (six) hours as needed for headache or moderate pain.     Historical Provider, MD   BP 121/73 mmHg  Pulse 63  Temp(Src) 98.1 F (36.7 C) (Oral)  Resp 16  Ht 6' (1.829 m)  Wt 170 lb (77.111 kg)  BMI 23.05 kg/m2  SpO2 99% Physical Exam  Constitutional: He is oriented to person, place, and time. He appears  well-developed and well-nourished. No distress.  HENT:  Head: Normocephalic and atraumatic.  Eyes: Conjunctivae and EOM are normal.  Neck: Neck supple. No tracheal deviation present.  Cardiovascular: Normal rate.   Pulmonary/Chest: Effort normal. No respiratory distress.  Abdominal: There is no tenderness.  Genitourinary: Penis normal. No discharge found.  Musculoskeletal: Normal range of motion.  Neurological: He is alert and oriented to person, place, and time.  Skin: Skin is warm and dry.  Psychiatric: He has a normal mood and affect. His behavior is normal.  Nursing note and vitals reviewed.  ED Course  Procedures (including critical care time) DIAGNOSTIC STUDIES: Oxygen Saturation is 99% on RA, normal by my interpretation.    COORDINATION OF CARE: 11:54 AM-Discussed treatment plan which includes UA and STD screening with pt at bedside and pt agreed to plan.   Labs Review Labs Reviewed  URINALYSIS, ROUTINE W REFLEX MICROSCOPIC (NOT AT [facility]) - Abnormal; Notable for the following:    APPearance CLOUDY (*)    Leukocytes, UA TRACE (*)    All other components within normal limits  URINE MICROSCOPIC-ADD ON  HIV ANTIBODY (ROUTINE TESTING)  RPR  GC/CHLAMYDIA PROBE AMP ([facility]) NOT AT [facility]   Imaging Review No results found. I have personally reviewed and evaluated these images and lab results as part of my medical decision-making.   EKG Interpretation None      MDM  Final diagnoses:  Urethritis    Treated for possible std with zithromax and rocephin and sent home with cipro. Discussed safe sex practices  I personally performed the services described in this documentation, which was scribed in my presence. The recorded information has been reviewed and is accurate.    Teressa Lower, NP 12/08/14 1250  Elwin Mocha, MD 12/08/14 [phone]

## 2014-12-08 NOTE — Discharge Instructions (Signed)
Urethritis °Urethritis is an inflammation of the tube through which urine exits your bladder (urethra).  °CAUSES °Urethritis is often caused by an infection in your urethra. The infection can be viral, like herpes. The infection can also be bacterial, like gonorrhea. °RISK FACTORS °Risk factors of urethritis include: °· Having sex without using a condom. °· Having multiple sexual partners. °· Having poor hygiene. °SIGNS AND SYMPTOMS °Symptoms of urethritis are less noticeable in women than in men. These symptoms include: °· Burning feeling when you urinate (dysuria). °· Discharge from your urethra. °· Blood in your urine (hematuria). °· Urinating more than usual. °DIAGNOSIS  °To confirm a diagnosis of urethritis, your health care provider will do the following: °· Ask about your sexual history. °· Perform a physical exam. °· Have you provide a sample of your urine for lab testing. °· Use a cotton swab to gently collect a sample from your urethra for lab testing. °TREATMENT  °It is important to treat urethritis. Depending on the cause, untreated urethritis may lead to serious genital infections and possibly infertility. Urethritis caused by a bacterial infection is treated with antibiotic medicine. All sexual partners must be treated.  °HOME CARE INSTRUCTIONS °· Do not have sex until the test results are known and treatment is completed, even if your symptoms go away before you finish treatment. °· If you were prescribed an antibiotic, finish it all even if you start to feel better. °SEEK MEDICAL CARE IF:  °· Your symptoms are not improved in 3 days. °· Your symptoms are getting worse. °· You develop abdominal pain or pelvic pain (in women). °· You develop joint pain. °· You have a fever. °SEEK IMMEDIATE MEDICAL CARE IF:  °· You have severe pain in the belly, back, or side. °· You have repeated vomiting. °MAKE SURE YOU: °· Understand these instructions. °· Will watch your condition. °· Will get help right away if you  are not doing well or get worse. °Document Released: 09/19/2000 Document Revised: 08/10/2013 Document Reviewed: 11/24/2012 °ExitCare® Patient Information ©2015 [employer], [employer]. This information is not intended to replace advice given to you by your health care provider. Make sure you discuss any questions you have with your health care provider. ° °

## 2014-12-09 LAB — GC/CHLAMYDIA PROBE AMP (~~LOC~~) NOT AT ARMC
Chlamydia: NEGATIVE
Neisseria Gonorrhea: NEGATIVE

## 2014-12-09 LAB — RPR: RPR: NONREACTIVE

## 2014-12-09 LAB — HIV ANTIBODY (ROUTINE TESTING W REFLEX): HIV Screen 4th Generation wRfx: NONREACTIVE

## 2015-03-04 ENCOUNTER — Encounter (HOSPITAL_COMMUNITY): Payer: Self-pay | Admitting: Emergency Medicine

## 2015-03-04 ENCOUNTER — Emergency Department (HOSPITAL_COMMUNITY)
Admission: EM | Admit: 2015-03-04 | Discharge: 2015-03-04 | Disposition: A | Discharge status: Home / Self Care | Payer: No Typology Code available for payment source | Attending: Emergency Medicine | Admitting: Emergency Medicine

## 2015-03-04 DIAGNOSIS — F17210 Nicotine dependence, cigarettes, uncomplicated: Secondary | ICD-10-CM | POA: Insufficient documentation

## 2015-03-04 DIAGNOSIS — R360 Urethral discharge without blood: Secondary | ICD-10-CM | POA: Insufficient documentation

## 2015-03-04 DIAGNOSIS — Z792 Long term (current) use of antibiotics: Secondary | ICD-10-CM | POA: Insufficient documentation

## 2015-03-04 DIAGNOSIS — R369 Urethral discharge, unspecified: Secondary | ICD-10-CM

## 2015-03-04 LAB — URINALYSIS, ROUTINE W REFLEX MICROSCOPIC
Bilirubin Urine: NEGATIVE
Glucose, UA: NEGATIVE mg/dL
Hgb urine dipstick: NEGATIVE
Ketones, ur: NEGATIVE mg/dL
NITRITE: NEGATIVE
PROTEIN: NEGATIVE mg/dL
Specific Gravity, Urine: 1.022 (ref 1.005–1.030)
pH: 7 (ref 5.0–8.0)

## 2015-03-04 LAB — URINE MICROSCOPIC-ADD ON

## 2015-03-04 MED ORDER — AZITHROMYCIN 250 MG PO TABS
1000.0000 mg | ORAL_TABLET | Freq: Once | ORAL | Status: AC
  Administered 2015-03-04: 1000 mg via ORAL
  Filled 2015-03-04: qty 4

## 2015-03-04 MED ORDER — STERILE WATER FOR INJECTION IJ SOLN
INTRAMUSCULAR | Status: AC
  Administered 2015-03-04: 1.2 mL
  Filled 2015-03-04: qty 10

## 2015-03-04 MED ORDER — CEFTRIAXONE SODIUM 250 MG IJ SOLR
250.0000 mg | Freq: Once | INTRAMUSCULAR | Status: AC
  Administered 2015-03-04: 250 mg via INTRAMUSCULAR
  Filled 2015-03-04: qty 250

## 2015-03-04 NOTE — Discharge Instructions (Signed)
You will be contacted if you tested positive for infection.  No evidence of urinary tract infection today.  Avoid sexual activity until your symptoms are completely resolved.  Notify partner if you tested positive.  Sexually Transmitted Disease A sexually transmitted disease (STD) is a disease or infection that may be passed (transmitted) from person to person, usually during sexual activity. This may happen by way of saliva, semen, blood, vaginal mucus, or urine. Common STDs include:  Gonorrhea.  Chlamydia.  Syphilis.  HIV and AIDS.  Genital herpes.  Hepatitis B and C.  Trichomonas.  Human papillomavirus (HPV).  Pubic lice.  Scabies.  Mites.  Bacterial vaginosis. WHAT ARE CAUSES OF STDs? An STD may be caused by bacteria, a virus, or parasites. STDs are often transmitted during sexual activity if one person is infected. However, they may also be transmitted through nonsexual means. STDs may be transmitted after:   Sexual intercourse with an infected person.  Sharing sex toys with an infected person.  Sharing needles with an infected person or using unclean piercing or tattoo needles.  Having intimate contact with the genitals, mouth, or rectal areas of an infected person.  Exposure to infected fluids during birth. WHAT ARE THE SIGNS AND SYMPTOMS OF STDs? Different STDs have different symptoms. Some people may not have any symptoms. If symptoms are present, they may include:  Painful or bloody urination.  Pain in the pelvis, abdomen, vagina, anus, throat, or eyes.  A skin rash, itching, or irritation.  Growths, ulcerations, blisters, or sores in the genital and anal areas.  Abnormal vaginal discharge with or without bad odor.  Penile discharge in men.  Fever.  Pain or bleeding during sexual intercourse.  Swollen glands in the groin area.  Yellow skin and eyes (jaundice). This is seen with hepatitis.  Swollen testicles.  Infertility.  Sores and  blisters in the mouth. HOW ARE STDs DIAGNOSED? To make a diagnosis, your health care provider may:  Take a medical history.  Perform a physical exam.  Take a sample of any discharge to examine.  Swab the throat, cervix, opening to the penis, rectum, or vagina for testing.  Test a sample of your first morning urine.  Perform blood tests.  Perform a Pap test, if this applies.  Perform a colposcopy.  Perform a laparoscopy. HOW ARE STDs TREATED? Treatment depends on the STD. Some STDs may be treated but not cured.  Chlamydia, gonorrhea, trichomonas, and syphilis can be cured with antibiotic medicine.  Genital herpes, hepatitis, and HIV can be treated, but not cured, with prescribed medicines. The medicines lessen symptoms.  Genital warts from HPV can be treated with medicine or by freezing, burning (electrocautery), or surgery. Warts may come back.  HPV cannot be cured with medicine or surgery. However, abnormal areas may be removed from the cervix, vagina, or vulva.  If your diagnosis is confirmed, your recent sexual partners need treatment. This is true even if they are symptom-free or have a negative culture or evaluation. They should not have sex until their health care providers say it is okay.  Your health care provider may test you for infection again 3 months after treatment. HOW CAN I REDUCE MY RISK OF GETTING AN STD? Take these steps to reduce your risk of getting an STD:  Use latex condoms, dental dams, and water-soluble lubricants during sexual activity. Do not use petroleum jelly or oils.  Avoid having multiple sex partners.  Do not have sex with someone who has other sex  partners  Do not have sex with anyone you do not know or who is at high risk for an STD.  Avoid risky sex practices that can break your skin.  Do not have sex if you have open sores on your mouth or skin.  Avoid drinking too much alcohol or taking illegal drugs. Alcohol and drugs can affect  your judgment and put you in a vulnerable position.  Avoid engaging in oral and anal sex acts.  Get vaccinated for HPV and hepatitis. If you have not received these vaccines in the past, talk to your health care provider about whether one or both might be right for you.  If you are at risk of being infected with HIV, it is recommended that you take a prescription medicine daily to prevent HIV infection. This is called pre-exposure prophylaxis (PrEP). You are considered at risk if:  You are a man who has sex with other men (MSM).  You are a heterosexual man or woman and are sexually active with more than one partner.  You take drugs by injection.  You are sexually active with a partner who has HIV.  Talk with your health care provider about whether you are at high risk of being infected with HIV. If you choose to begin PrEP, you should first be tested for HIV. You should then be tested every 3 months for as long as you are taking PrEP. WHAT SHOULD I DO IF I THINK I HAVE AN STD?  See your health care provider.  Tell your sexual partner(s). They should be tested and treated for any STDs.  Do not have sex until your health care provider says it is okay. WHEN SHOULD I GET IMMEDIATE MEDICAL CARE? Contact your health care provider right away if:   You have severe abdominal pain.  You are a man and notice swelling or pain in your testicles.  You are a woman and notice swelling or pain in your vagina.   This information is not intended to replace advice given to you by your health care provider. Make sure you discuss any questions you have with your health care provider.   Document Released: 06/16/2002 Document Revised: 04/16/2014 Document Reviewed: 10/14/2012 Elsevier Interactive Patient Education [employer]2016 [employer] Inc.

## 2015-03-04 NOTE — ED Notes (Signed)
Pt. Stated, Im having symptoms of a STD with burning on my penis.

## 2015-03-04 NOTE — ED Provider Notes (Signed)
CSN: [id number]     Arrival date & time 03/04/15  1000 History  By signing my name below, I, Larry Alvarez, attest that this documentation has been prepared under the direction and in the presence of Fayrene Helper, PA-C. Electronically Signed: Angelene Giovanni, ED Scribe. 03/04/2015. 11:52 AM.   Chief Complaint  Patient presents with  . Exposure to STD  . Penile Discharge   The history is provided by the patient. No language interpreter was used.   HPI Comments: Larry Alvarez is a 29 y.o. male who presents to the Emergency Department complaining of a gradually worsening constant burning sensation at tip of penis onset several days ago. He reports associated burning while urination. He denies any significant penile discharge, frequency, chills, or fever. He reports a hx of STI. He states that he has had 3-4 sexual partners in the past 6 months and admits to not using a condom each time with sexual contact. No alleviating factors noted. Pt is concerning of STI.  History reviewed. No pertinent past medical history. History reviewed. No pertinent past surgical history. No family history on file. Social History  Substance Use Topics  . Smoking status: Current Every Day Smoker    Types: Cigarettes  . Smokeless tobacco: None  . Alcohol Use: 1.2 oz/week    2 Cans of beer per week    Review of Systems  Constitutional: Negative for fever and chills.  Genitourinary: Positive for penile pain (burning sensation to tip of penis). Negative for frequency, discharge and penile swelling.      Allergies  Review of patient's allergies indicates no known allergies.  Home Medications   Prior to Admission medications   Medication Sig Start Date End Date Taking? Authorizing Provider  ciprofloxacin (CIPRO) 500 MG tablet Take 1 tablet (500 mg total) by mouth 2 (two) times daily. 12/08/14   Teressa Lower, NP  ibuprofen (ADVIL,MOTRIN) 200 MG tablet Take 200 mg by mouth every 6 (six) hours as  needed for headache or moderate pain.     Historical Provider, MD   BP 115/66 mmHg  Pulse 71  Temp(Src) 98 F (36.7 C) (Oral)  Resp 20  SpO2 98% Physical Exam  Constitutional: He is oriented to person, place, and time. He appears well-developed and well-nourished. No distress.  HENT:  Head: Normocephalic and atraumatic.  Eyes: Conjunctivae and EOM are normal.  Neck: Neck supple. No tracheal deviation present.  Cardiovascular: Normal rate.   Pulmonary/Chest: Effort normal. No respiratory distress.  Genitourinary: Circumcised.  No inguinal adenopathy noted Circumcisized penis, a couple of venereal warts noted that does not appear infected.  Musculoskeletal: Normal range of motion.  Lymphadenopathy:       Right: No inguinal adenopathy present.       Left: No inguinal adenopathy present.  Neurological: He is alert and oriented to person, place, and time.  Skin: Skin is warm and dry.  Psychiatric: He has a normal mood and affect. His behavior is normal.  Nursing note and vitals reviewed.   ED Course  Procedures (including critical care time) DIAGNOSTIC STUDIES: Oxygen Saturation is 98% on RA, normal by my interpretation.    COORDINATION OF CARE: 11:24 AM- Pt concern of STD.  Pt advised of plan for treatment and pt agrees. Will receive STI testing.    12:40 PM vereneal wart, recommend f/u with the Health Department.  STI testing obtained.  UA neg for UTI.  Rocephin/zithromax given.    Labs Review Labs Reviewed  URINALYSIS, ROUTINE W REFLEX  MICROSCOPIC (NOT AT [facility]) - Abnormal; Notable for the following:    Leukocytes, UA TRACE (*)    All other components within normal limits  URINE MICROSCOPIC-ADD ON - Abnormal; Notable for the following:    Squamous Epithelial / LPF 0-5 (*)    Bacteria, UA RARE (*)    All other components within normal limits  RPR  HIV ANTIBODY (ROUTINE TESTING)  GC/CHLAMYDIA PROBE AMP ([place]) NOT AT [facility]    Fayrene HelperBowie Joshoa Shawler, PA-C has personally  reviewed and evaluated these lab results as part of his medical decision-making.   MDM   Final diagnoses:  Abnormal penile discharge    BP 120/66 mmHg  Pulse 62  Temp(Src) 97.8 F (36.6 C) (Oral)  Resp 20  SpO2 99%  I personally performed the services described in this documentation, which was scribed in my presence. The recorded information has been reviewed and is accurate.     Fayrene HelperBowie Anndrea Mihelich, PA-C 03/04/15 1241  Alvira MondayErin Schlossman, MD 03/04/15 2022

## 2015-03-04 NOTE — ED Notes (Signed)
See PA assessment, PA at bedside with RN 

## 2015-03-05 LAB — HIV ANTIBODY (ROUTINE TESTING W REFLEX): HIV SCREEN 4TH GENERATION: NONREACTIVE

## 2015-03-05 LAB — RPR: RPR Ser Ql: NONREACTIVE

## 2015-03-07 LAB — GC/CHLAMYDIA PROBE AMP (~~LOC~~) NOT AT ARMC
Chlamydia: NEGATIVE
Neisseria Gonorrhea: NEGATIVE

## 2015-10-19 ENCOUNTER — Encounter (HOSPITAL_COMMUNITY): Payer: Self-pay | Admitting: Emergency Medicine

## 2015-10-19 ENCOUNTER — Emergency Department (HOSPITAL_COMMUNITY)
Admission: EM | Admit: 2015-10-19 | Discharge: 2015-10-19 | Disposition: A | Discharge status: Home / Self Care | Payer: No Typology Code available for payment source | Attending: Emergency Medicine | Admitting: Emergency Medicine

## 2015-10-19 DIAGNOSIS — R369 Urethral discharge, unspecified: Secondary | ICD-10-CM | POA: Insufficient documentation

## 2015-10-19 DIAGNOSIS — F17210 Nicotine dependence, cigarettes, uncomplicated: Secondary | ICD-10-CM | POA: Insufficient documentation

## 2015-10-19 MED ORDER — LIDOCAINE HCL 1 % IJ SOLN
INTRAMUSCULAR | Status: AC
  Administered 2015-10-19: 1 mL
  Filled 2015-10-19: qty 20

## 2015-10-19 MED ORDER — AZITHROMYCIN 250 MG PO TABS
1000.0000 mg | ORAL_TABLET | Freq: Every day | ORAL | Status: DC
  Administered 2015-10-19: 1000 mg via ORAL
  Filled 2015-10-19: qty 4

## 2015-10-19 MED ORDER — CEFTRIAXONE SODIUM 250 MG IJ SOLR
250.0000 mg | Freq: Once | INTRAMUSCULAR | Status: AC
  Administered 2015-10-19: 250 mg via INTRAMUSCULAR
  Filled 2015-10-19: qty 250

## 2015-10-19 NOTE — Discharge Instructions (Signed)
You may follow-up with the health dept for further STD screenings.

## 2015-10-19 NOTE — ED Notes (Signed)
Pt ambulatory and independent at discharge.  

## 2015-10-19 NOTE — ED Notes (Signed)
Pt c/o constant low abdominal and penile discomfort, creamy white discharge, urinary frequency. No lesions. No fevers, chills, nausea, emesis, dysuria. Pt reports recent hx of unprotected sexual intercourse with new partner.

## 2015-10-19 NOTE — ED Provider Notes (Signed)
CSN: [id number]     Arrival date & time 10/19/15  1053 History  By signing my name below, I, Levon Hedger, attest that this documentation has been prepared under the direction and in the presence of non-physician practitioner, Sharilyn Sites, PA-C Electronically Signed: Levon Hedger, Scribe. 10/19/2015. 2:57 PM.   Chief Complaint  Patient presents with  . Penile Discharge   The history is provided by the patient. No language interpreter was used.    HPI Comments:  Larry Alvarez is a 30 y.o. male who presents to the Emergency Department complaining of white penile discharge onset 1-2 days ago.  Pt reports recent unprotected sexual intercourse with a new partner. States unsure if partner is currently having any symptoms. Pt denies any  fever, rash, or dysuria. No alleviating factors noted. No other complaints at this time.  Does have hx of STD in the past with similar symptoms.  Denies abdominal pain, dysuria, nausea, vomiting, diarrhea.  VSS.  History reviewed. No pertinent past medical history. History reviewed. No pertinent past surgical history. History reviewed. No pertinent family history. Social History  Substance Use Topics  . Smoking status: Current Every Day Smoker    Types: Cigarettes  . Smokeless tobacco: None  . Alcohol Use: 1.2 oz/week    2 Cans of beer per week    Review of Systems  Genitourinary: Positive for discharge.  All other systems reviewed and are negative.   Allergies  Review of patient's allergies indicates no known allergies.  Home Medications   Prior to Admission medications   Medication Sig Start Date End Date Taking? Authorizing Provider  ciprofloxacin (CIPRO) 500 MG tablet Take 1 tablet (500 mg total) by mouth 2 (two) times daily. 12/08/14   Teressa Lower, NP  ibuprofen (ADVIL,MOTRIN) 200 MG tablet Take 200 mg by mouth every 6 (six) hours as needed for headache or moderate pain.     Historical Provider, MD   BP 119/77 mmHg  Pulse 75   Temp(Src) 98.5 F (36.9 C) (Oral)  Resp 16  Ht 6' (1.829 m)  Wt 175 lb (79.379 kg)  BMI 23.73 kg/m2  SpO2 100%   Physical Exam  Constitutional: He is oriented to person, place, and time. He appears well-developed and well-nourished.  HENT:  Head: Normocephalic and atraumatic.  Mouth/Throat: Oropharynx is clear and moist.  Eyes: Conjunctivae and EOM are normal. Pupils are equal, round, and reactive to light.  Neck: Normal range of motion.  Cardiovascular: Normal rate, regular rhythm and normal heart sounds.   Pulmonary/Chest: Effort normal and breath sounds normal.  Abdominal: Soft. Bowel sounds are normal.  Genitourinary:  Declined genital exam  Musculoskeletal: Normal range of motion.  Neurological: He is alert and oriented to person, place, and time.  Skin: Skin is warm and dry.  Psychiatric: He has a normal mood and affect.  Nursing note and vitals reviewed.   ED Course  Procedures  DIAGNOSTIC STUDIES:  Oxygen Saturation is 100% on RA, normal by my interpretation.    COORDINATION OF CARE:  2:34 PM Offered STD testing. Pt opted for treatment only. Will order Zithromax and rocephin.  Discussed treatment plan with pt at bedside and pt agreed to plan.  Labs Review Labs Reviewed - No data to display  MDM   Final diagnoses:  Penile discharge   30 year old male here with penile discharge. History of STD in the past with similar symptoms. He is afebrile, nontoxic. Denies any abdominal pain, nausea, vomiting, or diarrhea. No urinary symptoms. Offered  testing, patient opted for treatment only. He was treated in the ED with Rocephin and azithromycin. Instructed to follow-up with the health department for further STD screenings as patient has been seen here numerous times in the past for same.  Discussed plan with patient, he/she acknowledged understanding and agreed with plan of care.  Return precautions given for new or worsening symptoms.  I personally performed the services  described in this documentation, which was scribed in my presence. The recorded information has been reviewed and is accurate.  Garlon HatchetLisa M Zeven Kocak, PA-C 10/19/15 1846  Azalia BilisKevin Campos, MD 10/20/15 1215

## 2016-01-25 ENCOUNTER — Emergency Department (HOSPITAL_COMMUNITY)
Admission: EM | Admit: 2016-01-25 | Discharge: 2016-01-25 | Disposition: A | Discharge status: Home / Self Care | Payer: No Typology Code available for payment source | Attending: Emergency Medicine | Admitting: Emergency Medicine

## 2016-01-25 ENCOUNTER — Encounter (HOSPITAL_COMMUNITY): Payer: Self-pay | Admitting: *Deleted

## 2016-01-25 DIAGNOSIS — F17210 Nicotine dependence, cigarettes, uncomplicated: Secondary | ICD-10-CM | POA: Insufficient documentation

## 2016-01-25 DIAGNOSIS — Z711 Person with feared health complaint in whom no diagnosis is made: Secondary | ICD-10-CM

## 2016-01-25 DIAGNOSIS — Z202 Contact with and (suspected) exposure to infections with a predominantly sexual mode of transmission: Secondary | ICD-10-CM | POA: Insufficient documentation

## 2016-01-25 HISTORY — DX: Chlamydial infection, unspecified: A74.9

## 2016-01-25 HISTORY — DX: Gonococcal infection, unspecified: A54.9

## 2016-01-25 LAB — URINALYSIS, ROUTINE W REFLEX MICROSCOPIC
Bilirubin Urine: NEGATIVE
GLUCOSE, UA: NEGATIVE mg/dL
Hgb urine dipstick: NEGATIVE
Ketones, ur: NEGATIVE mg/dL
LEUKOCYTES UA: NEGATIVE
NITRITE: NEGATIVE
PH: 6.5 (ref 5.0–8.0)
PROTEIN: NEGATIVE mg/dL
Specific Gravity, Urine: 1.027 (ref 1.005–1.030)

## 2016-01-25 LAB — RAPID HIV SCREEN (HIV 1/2 AB+AG)
HIV 1/2 ANTIBODIES: NONREACTIVE
HIV-1 P24 Antigen - HIV24: NONREACTIVE

## 2016-01-25 MED ORDER — STERILE WATER FOR INJECTION IJ SOLN
INTRAMUSCULAR | Status: AC
  Filled 2016-01-25: qty 10

## 2016-01-25 MED ORDER — CEFTRIAXONE SODIUM 250 MG IJ SOLR
250.0000 mg | Freq: Once | INTRAMUSCULAR | Status: AC
  Administered 2016-01-25: 250 mg via INTRAMUSCULAR
  Filled 2016-01-25: qty 250

## 2016-01-25 MED ORDER — AZITHROMYCIN 250 MG PO TABS
1000.0000 mg | ORAL_TABLET | Freq: Once | ORAL | Status: AC
  Administered 2016-01-25: 1000 mg via ORAL
  Filled 2016-01-25: qty 4

## 2016-01-25 NOTE — Discharge Instructions (Signed)
You have been evaluated for potential STD.  You will be notify in the next several days if you tested positive for any specific STD.  Please avoid sexual activities until your symptoms are completely resolved.  Notify partner to get treated if you are positive for infection.

## 2016-01-25 NOTE — ED Triage Notes (Addendum)
Patient c/o pain with urination x4 days.  Patient denies abdominal pain, flank pain and back pain.  Patient denies N/V/D and fever.  Patient denies penile discharge or scrotal swelling.  Patient concerned for GC/Chlamydia and has recent history of both.

## 2016-01-25 NOTE — ED Provider Notes (Signed)
[facility]-EMERGENCY DEPT Provider Note   CSN: [id number] Arrival date & time: 01/25/16  1214  By signing my name below, I, Modena Jansky, attest that this documentation has been prepared under the direction and in the presence of non-physician practitioner, Fayrene Helper, PA-C. Electronically Signed: Modena Jansky, Scribe. 01/25/2016. 1:25 PM.  History   Chief Complaint Chief Complaint  Patient presents with  . Dysuria   The history is provided by the patient. No language interpreter was used.   HPI Comments: Larry Alvarez is a 30 y.o. male with a hx of Chlamydia and Gonorrhea who presents to the Emergency Department complaining of constant moderate dysuria that started 4 days ago. He states that he has been having pain onset during urination. He describes the pain as a burning sensation. He reports that he has been having unprotected intercourse with 2-3 partners recently. He denies any fever, abdominal pain, back pain, nausea, or penile discharge.   Past Medical History:  Diagnosis Date  . Chlamydia   . Gonorrhea     There are no active problems to display for this patient.   No past surgical history on file.     Home Medications    Prior to Admission medications   Medication Sig Start Date End Date Taking? Authorizing Provider  ciprofloxacin (CIPRO) 500 MG tablet Take 1 tablet (500 mg total) by mouth 2 (two) times daily. 12/08/14   Teressa Lower, NP  ibuprofen (ADVIL,MOTRIN) 200 MG tablet Take 200 mg by mouth every 6 (six) hours as needed for headache or moderate pain.     Historical Provider, MD    Family History No family history on file.  Social History Social History  Substance Use Topics  . Smoking status: Current Every Day Smoker    Types: Cigarettes  . Smokeless tobacco: Never Used  . Alcohol use 1.2 oz/week    2 Cans of beer per week     Allergies   Review of patient's allergies indicates no known allergies.   Review of Systems Review of Systems    Constitutional: Negative for fever.  Gastrointestinal: Negative for abdominal pain and nausea.  Genitourinary: Positive for dysuria. Negative for discharge.  Musculoskeletal: Negative for back pain.     Physical Exam Updated Vital Signs BP 123/68 (BP Location: Left Arm)   Pulse 70   Temp 98.3 F (36.8 C) (Oral)   Resp 18   Ht 6' (1.829 m)   Wt 178 lb (80.7 kg)   SpO2 100%   BMI 24.14 kg/m   Physical Exam  Constitutional: He appears well-developed and well-nourished. No distress.  HENT:  Head: Normocephalic and atraumatic.  Eyes: Conjunctivae are normal.  Neck: Neck supple.  Cardiovascular: Normal rate.   Pulmonary/Chest: Effort normal.  Abdominal: Soft.  Genitourinary:  Genitourinary Comments: Chaperone present during exam.  No inguinal hernia or inguinal lymphadenopathy. Circumcised penis free of rash. No discharge at the meatus. Testicles are non tender. Scrotum is normal. Perineum is soft.   Musculoskeletal: Normal range of motion.  Neurological: He is alert.  Skin: Skin is warm and dry.  Psychiatric: He has a normal mood and affect.  Nursing note and vitals reviewed.    ED Treatments / Results  DIAGNOSTIC STUDIES: Oxygen Saturation is 100% on RA, normal by my interpretation.    COORDINATION OF CARE: 1:30 PM- Pt advised of plan for treatment, which includes antibiotic medication and STD check. Pt agrees with treatment plan.  Labs (all labs ordered are listed, but only abnormal results  are displayed) Labs Reviewed  URINALYSIS, ROUTINE W REFLEX MICROSCOPIC (NOT AT [facility])  RPR  RAPID HIV SCREEN (HIV 1/2 AB+AG)  GC/CHLAMYDIA PROBE AMP ([place]) NOT AT [facility]    EKG  EKG Interpretation None       Radiology No results found.  Procedures Procedures (including critical care time)  Medications Ordered in ED Medications  cefTRIAXone (ROCEPHIN) injection 250 mg (not administered)  azithromycin (ZITHROMAX) tablet 1,000 mg (not administered)      Initial Impression / Assessment and Plan / ED Course  I have reviewed the triage vital signs and the nursing notes.  Pertinent labs & imaging results that were available during my care of the patient were reviewed by me and considered in my medical decision making (see chart for details).  Clinical Course   BP 123/68 (BP Location: Left Arm)   Pulse 70   Temp 98.3 F (36.8 C) (Oral)   Resp 18   Ht 6' (1.829 m)   Wt 80.7 kg   SpO2 100%   BMI 24.14 kg/m   Patient to be discharged with instructions to follow up with OBGYN. Discussed importance of using protection when sexually active. Pt understands that they have GC/Chlamydia cultures pending and that they will need to inform all sexual partners if results return positive. Pt has been treated prophylacticly with azithromycin and rocephin due to pts history, pelvic exam, and wet prep with increased WBCs. Pt not concerning for PID because hemodynamically stable and no cervical motion tenderness on pelvic exam. Pt has also been treated with flagyl for Bacterial Vaginosis. Pt has been advised to not drink alcohol while on this medication.   Final Clinical Impressions(s) / ED Diagnoses   Final diagnoses:  Concern about STD in male without diagnosis    New Prescriptions New Prescriptions   No medications on file   I personally performed the services described in this documentation, which was scribed in my presence. The recorded information has been reviewed and is accurate.       Fayrene HelperBowie Karsyn Jamie, PA-C 01/25/16 1400    Jacalyn LefevreJulie Haviland, MD 01/25/16 1415

## 2016-01-26 LAB — RPR: RPR Ser Ql: NONREACTIVE

## 2016-01-27 LAB — GC/CHLAMYDIA PROBE AMP (~~LOC~~) NOT AT ARMC
CHLAMYDIA, DNA PROBE: NEGATIVE
NEISSERIA GONORRHEA: NEGATIVE

## 2016-04-22 ENCOUNTER — Encounter (HOSPITAL_COMMUNITY): Payer: Self-pay | Admitting: *Deleted

## 2016-04-22 ENCOUNTER — Emergency Department (HOSPITAL_COMMUNITY): Payer: Self-pay

## 2016-04-22 ENCOUNTER — Emergency Department (HOSPITAL_COMMUNITY)
Admission: EM | Admit: 2016-04-22 | Discharge: 2016-04-22 | Disposition: A | Discharge status: Home / Self Care | Payer: Self-pay | Attending: Emergency Medicine | Admitting: Emergency Medicine

## 2016-04-22 DIAGNOSIS — R300 Dysuria: Secondary | ICD-10-CM | POA: Insufficient documentation

## 2016-04-22 DIAGNOSIS — R1012 Left upper quadrant pain: Secondary | ICD-10-CM | POA: Insufficient documentation

## 2016-04-22 DIAGNOSIS — R0781 Pleurodynia: Secondary | ICD-10-CM | POA: Insufficient documentation

## 2016-04-22 DIAGNOSIS — F17210 Nicotine dependence, cigarettes, uncomplicated: Secondary | ICD-10-CM | POA: Insufficient documentation

## 2016-04-22 DIAGNOSIS — R109 Unspecified abdominal pain: Secondary | ICD-10-CM

## 2016-04-22 LAB — URINALYSIS, ROUTINE W REFLEX MICROSCOPIC
BILIRUBIN URINE: NEGATIVE
Glucose, UA: NEGATIVE mg/dL
Hgb urine dipstick: NEGATIVE
KETONES UR: NEGATIVE mg/dL
LEUKOCYTES UA: NEGATIVE
NITRITE: NEGATIVE
PH: 8 (ref 5.0–8.0)
Protein, ur: NEGATIVE mg/dL
SPECIFIC GRAVITY, URINE: 1.019 (ref 1.005–1.030)

## 2016-04-22 LAB — CBC WITH DIFFERENTIAL/PLATELET
BASOS ABS: 0 10*3/uL (ref 0.0–0.1)
BASOS PCT: 1 %
Eosinophils Absolute: 0.3 10*3/uL (ref 0.0–0.7)
Eosinophils Relative: 7 %
HCT: 44.9 % (ref 39.0–52.0)
Hemoglobin: 15.4 g/dL (ref 13.0–17.0)
LYMPHS PCT: 39 %
Lymphs Abs: 1.4 10*3/uL (ref 0.7–4.0)
MCH: 32.4 pg (ref 26.0–34.0)
MCHC: 34.3 g/dL (ref 30.0–36.0)
MCV: 94.3 fL (ref 78.0–100.0)
MONO ABS: 0.3 10*3/uL (ref 0.1–1.0)
Monocytes Relative: 8 %
NEUTROS ABS: 1.6 10*3/uL — AB (ref 1.7–7.7)
NEUTROS PCT: 45 %
Platelets: 199 10*3/uL (ref 150–400)
RBC: 4.76 MIL/uL (ref 4.22–5.81)
RDW: 13.3 % (ref 11.5–15.5)
WBC: 3.5 10*3/uL — AB (ref 4.0–10.5)

## 2016-04-22 LAB — COMPREHENSIVE METABOLIC PANEL
ALBUMIN: 3.9 g/dL (ref 3.5–5.0)
ALK PHOS: 56 U/L (ref 38–126)
ALT: 20 U/L (ref 17–63)
ANION GAP: 11 (ref 5–15)
AST: 31 U/L (ref 15–41)
BILIRUBIN TOTAL: 1.7 mg/dL — AB (ref 0.3–1.2)
BUN: 10 mg/dL (ref 6–20)
CALCIUM: 9.6 mg/dL (ref 8.9–10.3)
CO2: 27 mmol/L (ref 22–32)
Chloride: 103 mmol/L (ref 101–111)
Creatinine, Ser: 0.91 mg/dL (ref 0.61–1.24)
Glucose, Bld: 89 mg/dL (ref 65–99)
POTASSIUM: 5.1 mmol/L (ref 3.5–5.1)
Sodium: 141 mmol/L (ref 135–145)
TOTAL PROTEIN: 7.7 g/dL (ref 6.5–8.1)

## 2016-04-22 LAB — LIPASE, BLOOD: Lipase: 24 U/L (ref 11–51)

## 2016-04-22 MED ORDER — LIDOCAINE HCL (PF) 1 % IJ SOLN
2.0000 mL | Freq: Once | INTRAMUSCULAR | Status: AC
  Administered 2016-04-22: 14:00:00
  Filled 2016-04-22: qty 5

## 2016-04-22 MED ORDER — SODIUM CHLORIDE 0.9 % IV BOLUS (SEPSIS)
1000.0000 mL | Freq: Once | INTRAVENOUS | Status: AC
  Administered 2016-04-22: 1000 mL via INTRAVENOUS

## 2016-04-22 MED ORDER — AZITHROMYCIN 250 MG PO TABS
1000.0000 mg | ORAL_TABLET | Freq: Once | ORAL | Status: AC
  Administered 2016-04-22: 1000 mg via ORAL
  Filled 2016-04-22: qty 4

## 2016-04-22 MED ORDER — KETOROLAC TROMETHAMINE 30 MG/ML IJ SOLN
15.0000 mg | Freq: Once | INTRAMUSCULAR | Status: AC
  Administered 2016-04-22: 15 mg via INTRAVENOUS
  Filled 2016-04-22: qty 1

## 2016-04-22 MED ORDER — ONDANSETRON HCL 4 MG/2ML IJ SOLN
4.0000 mg | Freq: Once | INTRAMUSCULAR | Status: AC
  Administered 2016-04-22: 4 mg via INTRAVENOUS
  Filled 2016-04-22: qty 2

## 2016-04-22 MED ORDER — FAMOTIDINE 20 MG PO TABS: 20.0000 mg | ORAL_TABLET | Freq: Two times a day (BID) | ORAL | 0 refills | Status: DC

## 2016-04-22 MED ORDER — CEFTRIAXONE SODIUM 250 MG IJ SOLR
250.0000 mg | Freq: Once | INTRAMUSCULAR | Status: AC
  Administered 2016-04-22: 250 mg via INTRAMUSCULAR
  Filled 2016-04-22: qty 250

## 2016-04-22 NOTE — ED Provider Notes (Signed)
[facility]-EMERGENCY DEPT Provider Note   CSN: [id number] Arrival date & time: 04/22/16  0907     History   Chief Complaint Chief Complaint  Patient presents with  . Back Pain  . SEXUALLY TRANSMITTED DISEASE    HPI Larry Alvarez is a 31 y.o. male.  HPI   Pt with hx STD p/w two complaints.   For the past few weeks pt has had LUQ abdominal pain, left flank/left rib cage pain that comes at goes.  Associated nausea, lightheadedness, occasional SOB.  The symptoms are worse with ETOH use.  He does have chest pain that is on the right side, is intermittent, and random.  Denies hx reflux.  Denies bowel changes.  Has taken pepto bismol without improvement.    Pt also notes some dysuria, tingling in his penis with urination that feels like prior STDs.  Denies testicular pain or swelling, discharge from his penis.    Past Medical History:  Diagnosis Date  . Chlamydia   . Gonorrhea     There are no active problems to display for this patient.   History reviewed. No pertinent surgical history.     Home Medications    Prior to Admission medications   Medication Sig Start Date End Date Taking? Authorizing Provider  ibuprofen (ADVIL,MOTRIN) 200 MG tablet Take 200 mg by mouth every 6 (six) hours as needed for headache or moderate pain.    Yes Historical Provider, MD  famotidine (PEPCID) 20 MG tablet Take 1 tablet (20 mg total) by mouth 2 (two) times daily. 04/22/16   Trixie Dredge, PA-C    Family History History reviewed. No pertinent family history.  Social History Social History  Substance Use Topics  . Smoking status: Current Every Day Smoker    Types: Cigarettes  . Smokeless tobacco: Never Used  . Alcohol use 1.2 oz/week    2 Cans of beer per week     Allergies   Patient has no known allergies.   Review of Systems Review of Systems  All other systems reviewed and are negative.    Physical Exam Updated Vital Signs BP 106/72 (BP Location: Right Arm)   Pulse 67    Temp 99.1 F (37.3 C) (Oral)   Resp 18   SpO2 98%   Physical Exam  Constitutional: He appears well-developed and well-nourished. No distress.  HENT:  Head: Normocephalic and atraumatic.  Neck: Neck supple.  Cardiovascular: Normal rate and regular rhythm.   Pulmonary/Chest: Effort normal and breath sounds normal. No respiratory distress. He has no wheezes. He has no rales. He exhibits tenderness (left chest wall (lower ribs)).  Abdominal: Soft. He exhibits no distension and no mass. There is tenderness (upper abdomen, diffuse). There is no rebound and no guarding.  Genitourinary: Testes normal and penis normal. Right testis shows no mass, no swelling and no tenderness. Right testis is descended. Left testis shows no mass, no swelling and no tenderness. Left testis is descended. Circumcised. No penile erythema or penile tenderness. No discharge found.  Neurological: He is alert. He exhibits normal muscle tone.  Skin: He is not diaphoretic.  Nursing note and vitals reviewed.    ED Treatments / Results  Labs (all labs ordered are listed, but only abnormal results are displayed) Labs Reviewed  COMPREHENSIVE METABOLIC PANEL - Abnormal; Notable for the following:       Result Value   Total Bilirubin 1.7 (*)    All other components within normal limits  CBC WITH DIFFERENTIAL/PLATELET - Abnormal;  Notable for the following:    WBC 3.5 (*)    Neutro Abs 1.6 (*)    All other components within normal limits  LIPASE, BLOOD  URINALYSIS, ROUTINE W REFLEX MICROSCOPIC  RPR  HIV ANTIBODY (ROUTINE TESTING)  GC/CHLAMYDIA PROBE AMP ([place]) NOT AT [facility]    EKG  EKG Interpretation  Date/Time:  Sunday April 22 2016 12:16:39 EST Ventricular Rate:  56 PR Interval:    QRS Duration: 92 QT Interval:  416 QTC Calculation: 402 R Axis:   66 Text Interpretation:  Sinus bradycardia no acute ST/T changes rate a little slower, otherwise no change since 2014 Confirmed by GOLDSTON MD, SCOTT  [phone]([id number]) on 04/22/2016 12:43:34 PM       Radiology Dg Chest 2 View  Result Date: 04/22/2016 CLINICAL DATA:  Pt c/o left rib/chest pain APPROX.1 month that has been constant. Smoker APPROX.1/2 PPD. H/o bronchitis. 2 images. EXAM: CHEST  2 VIEW COMPARISON:  None. FINDINGS: Midline trachea.  Normal heart size and mediastinal contours. Sharp costophrenic angles.  No pneumothorax.  Clear lungs. IMPRESSION: No active cardiopulmonary disease. Electronically Signed   By: Jeronimo GreavesKyle  Talbot M.D.   On: 04/22/2016 13:52    Procedures Procedures (including critical care time)  Medications Ordered in ED Medications  sodium chloride 0.9 % bolus 1,000 mL (0 mLs Intravenous Stopped 04/22/16 1433)  ondansetron (ZOFRAN) injection 4 mg (4 mg Intravenous Given 04/22/16 1207)  ketorolac (TORADOL) 30 MG/ML injection 15 mg (15 mg Intravenous Given 04/22/16 1331)  cefTRIAXone (ROCEPHIN) injection 250 mg (250 mg Intramuscular Given 04/22/16 1429)  azithromycin (ZITHROMAX) tablet 1,000 mg (1,000 mg Oral Given 04/22/16 1428)  lidocaine (PF) (XYLOCAINE) 1 % injection 2 mL ( Infiltration Given 04/22/16 1429)     Initial Impression / Assessment and Plan / ED Course  I have reviewed the triage vital signs and the nursing notes.  Pertinent labs & imaging results that were available during my care of the patient were reviewed by me and considered in my medical decision making (see chart for details).  Clinical Course     Afebrile, nontoxic patient with several months of LUQ abdominal pain that is worse with drinking ETOH - possibly gastritis or GERD.  Workup unremarkable.  Pt also with symptoms concerning for STD, requested empiric treatment.   D/C home with PCP follow up, famotidine.   Discussed result, findings, treatment, and follow up  with patient.  Pt given return precautions.  Pt verbalizes understanding and agrees with plan.       Final Clinical Impressions(s) / ED Diagnoses   Final diagnoses:  Left sided abdominal  pain  Dysuria    New Prescriptions Discharge Medication List as of 04/22/2016  2:27 PM    START taking these medications   Details  famotidine (PEPCID) 20 MG tablet Take 1 tablet (20 mg total) by mouth 2 (two) times daily., Starting Sun 04/22/2016, Print         AgricolaEmily Dvante Hands, PA-C 04/22/16 1914    Pricilla LovelessScott Goldston, MD 04/24/16 [phone]

## 2016-04-22 NOTE — ED Triage Notes (Signed)
Pt reports left side and back pain for extended amount of time. Denies recent cough, denies difficulty urinating. Also requesting std check.

## 2016-04-22 NOTE — Discharge Instructions (Signed)
Read the information below.  Use the prescribed medication as directed.  Please discuss all new medications with your pharmacist.  You may return to the Emergency Department at any time for worsening condition or any new symptoms that concern you.   If you develop high fevers, worsening abdominal pain, uncontrolled vomiting, or are unable to tolerate fluids by mouth, return to the ER for a recheck.  ° °

## 2016-04-23 LAB — HIV ANTIBODY (ROUTINE TESTING W REFLEX): HIV SCREEN 4TH GENERATION: NONREACTIVE

## 2016-04-23 LAB — GC/CHLAMYDIA PROBE AMP (~~LOC~~) NOT AT ARMC
CHLAMYDIA, DNA PROBE: NEGATIVE
NEISSERIA GONORRHEA: NEGATIVE

## 2016-04-23 LAB — RPR: RPR Ser Ql: NONREACTIVE

## 2016-11-29 ENCOUNTER — Emergency Department (HOSPITAL_COMMUNITY)
Admission: EM | Admit: 2016-11-29 | Discharge: 2016-11-29 | Disposition: A | Discharge status: Home / Self Care | Payer: No Typology Code available for payment source | Attending: Emergency Medicine | Admitting: Emergency Medicine

## 2016-11-29 ENCOUNTER — Encounter (HOSPITAL_COMMUNITY): Payer: Self-pay | Admitting: Emergency Medicine

## 2016-11-29 DIAGNOSIS — F17210 Nicotine dependence, cigarettes, uncomplicated: Secondary | ICD-10-CM | POA: Insufficient documentation

## 2016-11-29 DIAGNOSIS — R360 Urethral discharge without blood: Secondary | ICD-10-CM | POA: Insufficient documentation

## 2016-11-29 DIAGNOSIS — R102 Pelvic and perineal pain: Secondary | ICD-10-CM | POA: Insufficient documentation

## 2016-11-29 DIAGNOSIS — Z202 Contact with and (suspected) exposure to infections with a predominantly sexual mode of transmission: Secondary | ICD-10-CM | POA: Insufficient documentation

## 2016-11-29 DIAGNOSIS — Z79899 Other long term (current) drug therapy: Secondary | ICD-10-CM | POA: Insufficient documentation

## 2016-11-29 HISTORY — DX: Bronchitis, not specified as acute or chronic: J40

## 2016-11-29 MED ORDER — METRONIDAZOLE 500 MG PO TABS
2000.0000 mg | ORAL_TABLET | Freq: Once | ORAL | Status: AC
  Administered 2016-11-29: 2000 mg via ORAL
  Filled 2016-11-29: qty 4

## 2016-11-29 MED ORDER — STERILE WATER FOR INJECTION IJ SOLN
INTRAMUSCULAR | Status: AC
  Administered 2016-11-29: 0.9 mL
  Filled 2016-11-29: qty 10

## 2016-11-29 MED ORDER — AZITHROMYCIN 250 MG PO TABS
1000.0000 mg | ORAL_TABLET | Freq: Once | ORAL | Status: AC
  Administered 2016-11-29: 1000 mg via ORAL
  Filled 2016-11-29: qty 4

## 2016-11-29 MED ORDER — CEFTRIAXONE SODIUM 250 MG IJ SOLR
250.0000 mg | Freq: Once | INTRAMUSCULAR | Status: AC
  Administered 2016-11-29: 250 mg via INTRAMUSCULAR
  Filled 2016-11-29: qty 250

## 2016-11-29 NOTE — ED Triage Notes (Signed)
Pt reports clear discharge from penis x 2 days. Denies burning on urination

## 2016-11-29 NOTE — ED Provider Notes (Signed)
[facility]-EMERGENCY DEPT Provider Note   CSN: [id number] Arrival date & time: 11/29/16  1258     History   Chief Complaint Chief Complaint  Patient presents with  . SEXUALLY TRANSMITTED DISEASE    HPI Larry Alvarez is a 31 y.o. male who presents to the emergency department with a chief complaint of penile pain began 2 days ago. He also reports questionable clear discharge from his penis and pelvic pain for the last 2 days. He denies dysuria, frequency, hematuria, hesitancy, or back pain. He reports his significant other was recently screened for STDs at the health department. He is unsure of the results of her tests, but wants to get screened, he is having symptoms. He denies penile swelling, testicular pain, or scrotal swelling.  He reports he has previously been treated for gonorrhea and chlamydia. He is currently sexually active with one male partner.  The history is provided by the patient. No language interpreter was used.    Past Medical History:  Diagnosis Date  . Bronchitis   . Chlamydia   . Gonorrhea     There are no active problems to display for this patient.   History reviewed. No pertinent surgical history.     Home Medications    Prior to Admission medications   Medication Sig Start Date End Date Taking? Authorizing Provider  famotidine (PEPCID) 20 MG tablet Take 1 tablet (20 mg total) by mouth 2 (two) times daily. 04/22/16   Trixie Dredge, PA-C  ibuprofen (ADVIL,MOTRIN) 200 MG tablet Take 200 mg by mouth every 6 (six) hours as needed for headache or moderate pain.     [provider]    Family History Family History  Problem Relation Age of Onset  . Cancer Mother     Social History Social History  Substance Use Topics  . Smoking status: Current Every Day Smoker    Packs/day: 0.50    Types: Cigarettes  . Smokeless tobacco: Never Used  . Alcohol use 1.2 oz/week    2 Cans of beer per week     Allergies   Patient has no known  allergies.   Review of Systems Review of Systems  Constitutional: Negative for activity change, chills and fever.  Respiratory: Negative for shortness of breath.   Cardiovascular: Negative for chest pain.  Gastrointestinal: Negative for abdominal pain, diarrhea, nausea and vomiting.  Genitourinary: Positive for discharge and penile pain. Negative for dysuria, frequency, penile swelling, scrotal swelling, testicular pain and urgency.       Pelvic pain  Musculoskeletal: Negative for back pain.  Skin: Negative for rash.     Physical Exam Updated Vital Signs BP 121/77 (BP Location: Right Arm)   Pulse 68   Temp 98.2 F (36.8 C) (Oral)   Resp 16   Wt 77.1 kg (170 lb)   SpO2 100%   BMI 23.06 kg/m   Physical Exam  Constitutional: He appears well-developed.  HENT:  Head: Normocephalic.  Eyes: Conjunctivae are normal.  Neck: Neck supple.  Cardiovascular: Normal rate and regular rhythm.   No murmur heard. Pulmonary/Chest: Effort normal.  Abdominal: Soft. He exhibits no distension. Hernia confirmed negative in the right inguinal area and confirmed negative in the left inguinal area.  Genitourinary: Testes normal and penis normal. No penile tenderness.  Genitourinary Comments: Chaperoned exam.  Lymphadenopathy: No inguinal adenopathy noted on the right or left side.  Neurological: He is alert.  Skin: Skin is warm and dry.  Psychiatric: His behavior is normal.  Nursing  note and vitals reviewed.    ED Treatments / Results  Labs (all labs ordered are listed, but only abnormal results are displayed) Labs Reviewed  WET PREP, GENITAL  GC/CHLAMYDIA PROBE AMP ([place]) NOT AT [facility]    EKG  EKG Interpretation None       Radiology No results found.  Procedures Procedures (including critical care time)  Medications Ordered in ED Medications  cefTRIAXone (ROCEPHIN) injection 250 mg (not administered)  azithromycin (ZITHROMAX) tablet 1,000 mg (not administered)    metroNIDAZOLE (FLAGYL) tablet 2,000 mg (not administered)     Initial Impression / Assessment and Plan / ED Course  I have reviewed the triage vital signs and the nursing notes.  Pertinent labs & imaging results that were available during my care of the patient were reviewed by me and considered in my medical decision making (see chart for details).     Patient is afebrile without abdominal tenderness, abdominal pain or painful bowel movements to indicate prostatitis.  No tenderness to palpation of the testes or epididymis to suggest orchitis or epididymitis.  STD cultures obtained including trichomonas, gonorrhea and chlamydia. Patient declines HIV and syphilis screening at this time. Discussed importance of using protection when sexually active. Pt understands that they have GC/Chlamydia cultures pending and that they will need to inform all sexual partners if results return positive. Patient has been treated prophylactically with azithromycin, metronidazole, and Rocephin.   Final Clinical Impressions(s) / ED Diagnoses   Final diagnoses:  Possible exposure to STD    New Prescriptions New Prescriptions   No medications on file     Barkley Boards, PA-C 11/29/16 1601    Long, Arlyss Repress, MD 11/29/16 [phone]

## 2016-11-29 NOTE — Discharge Instructions (Signed)
You have been treated today for gonorrhea, Chlamydia, and Trichomonas. Your test results will not be available until tomorrow. If you test positive, someone from the hospital will call you at the number you provided registration. It is important that if you have a positive test result the you let all sexual partners know. The CDC also recommends no sexual intercourse for 7 days after being treated.   You declined screening today for HIV or syphilis. I encourage you to get tested in the future.   If you develop new or worsening symptoms, including fever, chills, severe pelvic pain, back pain, or swelling or pain to the testicles, please return to the emergency department for reevaluation.  If he do not have a primary care provider you can call the number on her discharge paperwork to get established for follow-up.

## 2016-11-29 NOTE — ED Notes (Signed)
Discharge instructions reviewed with patient. Patient verbalizes understanding. VSS.   

## 2016-11-29 NOTE — ED Notes (Signed)
GC swab completed by EDPA- witness present. Patient tolerated well. Samples sent to the lab. Patient updated on plan of care.

## 2016-11-30 LAB — GC/CHLAMYDIA PROBE AMP (~~LOC~~) NOT AT ARMC
CHLAMYDIA, DNA PROBE: NEGATIVE
Neisseria Gonorrhea: NEGATIVE

## 2017-01-24 ENCOUNTER — Emergency Department (HOSPITAL_COMMUNITY)
Admission: EM | Admit: 2017-01-24 | Discharge: 2017-01-24 | Disposition: A | Discharge status: Home / Self Care | Payer: No Typology Code available for payment source | Attending: Emergency Medicine | Admitting: Emergency Medicine

## 2017-01-24 ENCOUNTER — Encounter (HOSPITAL_COMMUNITY): Payer: Self-pay | Admitting: Emergency Medicine

## 2017-01-24 DIAGNOSIS — R300 Dysuria: Secondary | ICD-10-CM | POA: Insufficient documentation

## 2017-01-24 DIAGNOSIS — Z791 Long term (current) use of non-steroidal anti-inflammatories (NSAID): Secondary | ICD-10-CM | POA: Insufficient documentation

## 2017-01-24 DIAGNOSIS — F17210 Nicotine dependence, cigarettes, uncomplicated: Secondary | ICD-10-CM | POA: Insufficient documentation

## 2017-01-24 DIAGNOSIS — Z202 Contact with and (suspected) exposure to infections with a predominantly sexual mode of transmission: Secondary | ICD-10-CM | POA: Insufficient documentation

## 2017-01-24 LAB — URINALYSIS, ROUTINE W REFLEX MICROSCOPIC
Bilirubin Urine: NEGATIVE
GLUCOSE, UA: NEGATIVE mg/dL
Hgb urine dipstick: NEGATIVE
KETONES UR: NEGATIVE mg/dL
LEUKOCYTES UA: NEGATIVE
NITRITE: NEGATIVE
PROTEIN: NEGATIVE mg/dL
Specific Gravity, Urine: 1.018 (ref 1.005–1.030)
pH: 6 (ref 5.0–8.0)

## 2017-01-24 MED ORDER — CEFTRIAXONE SODIUM 250 MG IJ SOLR
250.0000 mg | Freq: Once | INTRAMUSCULAR | Status: AC
  Administered 2017-01-24: 250 mg via INTRAMUSCULAR
  Filled 2017-01-24: qty 250

## 2017-01-24 MED ORDER — AZITHROMYCIN 250 MG PO TABS
1000.0000 mg | ORAL_TABLET | Freq: Once | ORAL | Status: AC
  Administered 2017-01-24: 1000 mg via ORAL
  Filled 2017-01-24: qty 4

## 2017-01-24 MED ORDER — LIDOCAINE HCL (PF) 1 % IJ SOLN
INTRAMUSCULAR | Status: AC
  Administered 2017-01-24: 5 mL
  Filled 2017-01-24: qty 5

## 2017-01-24 NOTE — ED Provider Notes (Signed)
[facility]-EMERGENCY DEPT Provider Note   CSN: [id number] Arrival date & time: 01/24/17  1130     History   Chief Complaint Chief Complaint  Patient presents with  . SEXUALLY TRANSMITTED DISEASE    HPI Larry Alvarez is a 31 y.o. male.  HPI 31 year old African-American male presents to the ED for STD treatment. Patient states that a week and a half ago he had unprotected sex. States a week ago he got a call from his partner that states that she was treated for gonorrhea and chlamydia. Patient states she does have some dysuria. He denies any other urinary symptoms. Denies any penile drainage. Denies any penile lesions. Denies any associated testicular pain, testicular swelling, fever, abdominal pain, nausea, emesis. The patient states he is seen here several times a year for STD exam and treatment. States this feels like his STD in the past. Past Medical History:  Diagnosis Date  . Bronchitis   . Chlamydia   . Gonorrhea     There are no active problems to display for this patient.   History reviewed. No pertinent surgical history.     Home Medications    Prior to Admission medications   Medication Sig Start Date End Date Taking? Authorizing Provider  famotidine (PEPCID) 20 MG tablet Take 1 tablet (20 mg total) by mouth 2 (two) times daily. 04/22/16   Trixie DredgeWest, Emily, PA-C  ibuprofen (ADVIL,MOTRIN) 200 MG tablet Take 200 mg by mouth every 6 (six) hours as needed for headache or moderate pain.     [provider]    Family History Family History  Problem Relation Age of Onset  . Cancer Mother     Social History Social History  Substance Use Topics  . Smoking status: Current Every Day Smoker    Packs/day: 0.50    Types: Cigarettes  . Smokeless tobacco: Never Used  . Alcohol use 1.2 oz/week    2 Cans of beer per week     Allergies   Patient has no known allergies.   Review of Systems Review of Systems  Constitutional:  Negative for chills and fever.  Gastrointestinal: Negative for abdominal pain, nausea and vomiting.  Genitourinary: Positive for dysuria. Negative for discharge, frequency, genital sores, hematuria, penile pain, penile swelling, scrotal swelling, testicular pain and urgency.  Skin: Negative for rash.     Physical Exam Updated Vital Signs BP 117/71 (BP Location: Right Arm)   Pulse 65   Temp 98.3 F (36.8 C) (Oral)   Resp 16   Ht 6' (1.829 m)   Wt 81.6 kg (180 lb)   SpO2 100%   BMI 24.41 kg/m   Physical Exam  Constitutional: He appears well-developed and well-nourished. No distress.  HENT:  Head: Normocephalic and atraumatic.  Eyes: Right eye exhibits no discharge. Left eye exhibits no discharge. No scleral icterus.  Neck: Normal range of motion.  Pulmonary/Chest: No respiratory distress.  Genitourinary:  Genitourinary Comments: Chaperone present for exam. Circumcised male. No penile discharge, erythema, tenderness, lesion, or rash. 2 descended testes without swelling, pain, lesions or rash. No inguinal lymphadenopathy or hernia.    Musculoskeletal: Normal range of motion.  Neurological: He is alert.  Skin: Skin is warm and dry. No pallor.  Psychiatric: His behavior is normal. Judgment and thought content normal.  Nursing note and vitals reviewed.    ED Treatments / Results  Labs (all labs ordered are listed, but only abnormal results are displayed) Labs Reviewed  URINALYSIS, ROUTINE W REFLEX  MICROSCOPIC  GC/CHLAMYDIA PROBE AMP ([place]) NOT AT [facility]    EKG  EKG Interpretation None       Radiology No results found.  Procedures Procedures (including critical care time)  Medications Ordered in ED Medications  cefTRIAXone (ROCEPHIN) injection 250 mg (250 mg Intramuscular Given 01/24/17 1430)  azithromycin (ZITHROMAX) tablet 1,000 mg (1,000 mg Oral Given 01/24/17 1429)  lidocaine (PF) (XYLOCAINE) 1 % injection (5 mLs  Given 01/24/17 1430)     Initial  Impression / Assessment and Plan / ED Course  I have reviewed the triage vital signs and the nursing notes.  Pertinent labs & imaging results that were available during my care of the patient were reviewed by me and considered in my medical decision making (see chart for details).     Patient treated in the ED for STI with azithromycin and Rocephin.. Patient advised to inform and treat all sexual partners.  Pt advised on safe sex practices and understands that they have GC/Chlamydia cultures pending and will result in 2-3 days. Patient refused HIV and RPR . Pt encouraged to follow up at local health department for future STI checks. No concern for prostatitis or epididymitis. Discussed return precautions. Pt appears safe for discharge.    Final Clinical Impressions(s) / ED Diagnoses   Final diagnoses:  Exposure to STD  Dysuria    New Prescriptions Discharge Medication List as of 01/24/2017  2:21 PM       Rise Mu, PA-C 01/24/17 1555    Alvira Monday, MD 01/24/17 2116

## 2017-01-24 NOTE — ED Triage Notes (Signed)
Pt reports he has tingling in his genital area. No dysuria, foul smell to urine, unusual urine color, or penile drainage. Pt thinks she he has a STD because his partner was recently treated for one.

## 2017-01-24 NOTE — Discharge Instructions (Signed)
Urine treated for gonorrhea and chlamydia. These cultures are pending. A formal sexual partners that he been treated for an STD. No sexual contact for 14 days. Use safe sex practices. May follow-up for further treatment and testing at the health Department.

## 2017-01-25 LAB — GC/CHLAMYDIA PROBE AMP (~~LOC~~) NOT AT ARMC
CHLAMYDIA, DNA PROBE: NEGATIVE
NEISSERIA GONORRHEA: NEGATIVE

## 2017-02-11 LAB — CYTOLOGY, (ORAL, ANAL, URETHRAL) ANCILLARY ONLY
Chlamydia: NEGATIVE
Neisseria Gonorrhea: NEGATIVE

## 2017-06-28 ENCOUNTER — Emergency Department (HOSPITAL_COMMUNITY)
Admission: EM | Admit: 2017-06-28 | Discharge: 2017-06-28 | Disposition: A | Discharge status: Home / Self Care | Payer: No Typology Code available for payment source | Attending: Emergency Medicine | Admitting: Emergency Medicine

## 2017-06-28 ENCOUNTER — Encounter (HOSPITAL_COMMUNITY): Payer: Self-pay | Admitting: Emergency Medicine

## 2017-06-28 ENCOUNTER — Other Ambulatory Visit: Payer: Self-pay

## 2017-06-28 DIAGNOSIS — Z202 Contact with and (suspected) exposure to infections with a predominantly sexual mode of transmission: Secondary | ICD-10-CM | POA: Insufficient documentation

## 2017-06-28 DIAGNOSIS — F17210 Nicotine dependence, cigarettes, uncomplicated: Secondary | ICD-10-CM | POA: Insufficient documentation

## 2017-06-28 DIAGNOSIS — N342 Other urethritis: Secondary | ICD-10-CM | POA: Insufficient documentation

## 2017-06-28 MED ORDER — CEFTRIAXONE SODIUM 250 MG IJ SOLR
250.0000 mg | Freq: Once | INTRAMUSCULAR | Status: AC
  Administered 2017-06-28: 250 mg via INTRAMUSCULAR
  Filled 2017-06-28: qty 250

## 2017-06-28 MED ORDER — LIDOCAINE HCL (PF) 1 % IJ SOLN
2.0000 mL | Freq: Once | INTRAMUSCULAR | Status: AC
  Administered 2017-06-28: 2 mL via INTRADERMAL
  Filled 2017-06-28: qty 30

## 2017-06-28 MED ORDER — AZITHROMYCIN 250 MG PO TABS
1000.0000 mg | ORAL_TABLET | Freq: Once | ORAL | Status: AC
  Administered 2017-06-28: 1000 mg via ORAL
  Filled 2017-06-28: qty 4

## 2017-06-28 NOTE — ED Triage Notes (Signed)
Pt arriving for STD check. Pt having difficulty emptying bladder. Pt states "when I try to pee, it feels like not all of it is coming out."

## 2017-06-28 NOTE — ED Provider Notes (Signed)
   [facility]-EMERGENCY DEPT Provider Note: Lowella DellJ. Lane Cesare Sumlin, MD, FACEP  CSN: [id number] MRN: [id number] ARRIVAL: 06/28/17 at 0527 ROOM: [facility]19/[facility]   CHIEF COMPLAINT  STD check   HISTORY OF PRESENT ILLNESS  06/28/17 5:53 AM Larry Alvarez is a 32 y.o. male with a 3-day history of discomfort at the end of his penis.  He describes this as mild burning, worse with urination.  He denies urethral discharge.  Symptoms are similar to previous chlamydia.  He denies abdominal pain.  He also feels like he is not completely emptying his bladder.   Past Medical History:  Diagnosis Date  . Bronchitis   . Chlamydia   . Gonorrhea     History reviewed. No pertinent surgical history.  Family History  Problem Relation Age of Onset  . Cancer Mother     Social History   Tobacco Use  . Smoking status: Current Every Day Smoker    Packs/day: 0.50    Types: Cigarettes  . Smokeless tobacco: Never Used  Substance Use Topics  . Alcohol use: Yes    Alcohol/week: 1.2 oz    Types: 2 Cans of beer per week  . Drug use: Yes    Types: Marijuana    Prior to Admission medications   Not on File    Allergies Patient has no known allergies.   REVIEW OF SYSTEMS  Negative except as noted here or in the History of Present Illness.   PHYSICAL EXAMINATION  Initial Vital Signs Blood pressure 115/80, pulse (!) 58, temperature 98 F (36.7 C), temperature source Oral, resp. rate 14, SpO2 94 %.  Examination General: Well-developed, well-nourished male in no acute distress; appearance consistent with age of record HENT: normocephalic; atraumatic Eyes: Normal appearance Neck: supple Heart: regular rate and rhythm Lungs: clear to auscultation bilaterally Abdomen: soft; nondistended; nontender; bowel sounds present GU: Urine clear, yellow Extremities: No deformity; full range of motion Neurologic: Awake, alert and oriented; motor function intact in all extremities and symmetric; no facial droop Skin:  Warm and dry Psychiatric: Normal mood and affect   RESULTS  Summary of this visit's results, reviewed by myself:   EKG Interpretation  Date/Time:    Ventricular Rate:    PR Interval:    QRS Duration:   QT Interval:    QTC Calculation:   R Axis:     Text Interpretation:        Laboratory Studies: No results found for this or any previous visit (from the past 24 hour(s)). Imaging Studies: No results found.  ED COURSE  Nursing notes and initial vitals signs, including pulse oximetry, reviewed.  Vitals:   06/28/17 0536  BP: 115/80  Pulse: (!) 58  Resp: 14  Temp: 98 F (36.7 C)  TempSrc: Oral  SpO2: 94%   GC and Chlamydia probe pending; urine culture pending.  We will treat for urethritis.  PROCEDURES    ED DIAGNOSES     ICD-10-CM   1. Urethritis N34.2        Emmalynne Courtney, MD 06/28/17 0600

## 2017-06-29 LAB — URINE CULTURE: CULTURE: NO GROWTH

## 2017-11-19 ENCOUNTER — Emergency Department (HOSPITAL_COMMUNITY)
Admission: EM | Admit: 2017-11-19 | Discharge: 2017-11-19 | Disposition: A | Discharge status: Home / Self Care | Payer: Self-pay | Attending: Emergency Medicine | Admitting: Emergency Medicine

## 2017-11-19 ENCOUNTER — Encounter (HOSPITAL_COMMUNITY): Payer: Self-pay

## 2017-11-19 ENCOUNTER — Other Ambulatory Visit: Payer: Self-pay

## 2017-11-19 DIAGNOSIS — R300 Dysuria: Secondary | ICD-10-CM | POA: Insufficient documentation

## 2017-11-19 DIAGNOSIS — F17210 Nicotine dependence, cigarettes, uncomplicated: Secondary | ICD-10-CM | POA: Insufficient documentation

## 2017-11-19 DIAGNOSIS — Z202 Contact with and (suspected) exposure to infections with a predominantly sexual mode of transmission: Secondary | ICD-10-CM | POA: Insufficient documentation

## 2017-11-19 LAB — URINALYSIS, ROUTINE W REFLEX MICROSCOPIC
BILIRUBIN URINE: NEGATIVE
GLUCOSE, UA: NEGATIVE mg/dL
HGB URINE DIPSTICK: NEGATIVE
Ketones, ur: NEGATIVE mg/dL
Leukocytes, UA: NEGATIVE
Nitrite: NEGATIVE
Protein, ur: NEGATIVE mg/dL
SPECIFIC GRAVITY, URINE: 1.015 (ref 1.005–1.030)
pH: 7 (ref 5.0–8.0)

## 2017-11-19 MED ORDER — LIDOCAINE HCL 1 % IJ SOLN
INTRAMUSCULAR | Status: AC
  Administered 2017-11-19: 20 mL
  Filled 2017-11-19: qty 20

## 2017-11-19 MED ORDER — AZITHROMYCIN 250 MG PO TABS
1000.0000 mg | ORAL_TABLET | Freq: Once | ORAL | Status: AC
  Administered 2017-11-19: 1000 mg via ORAL
  Filled 2017-11-19: qty 4

## 2017-11-19 MED ORDER — CEFTRIAXONE SODIUM 250 MG IJ SOLR
250.0000 mg | Freq: Once | INTRAMUSCULAR | Status: AC
  Administered 2017-11-19: 250 mg via INTRAMUSCULAR
  Filled 2017-11-19: qty 250

## 2017-11-19 NOTE — ED Notes (Signed)
Pt is alert and oriented x 4 and verbally responsive. Pt reports 7/10 penis pain at rest and report pain and burning with urination. GC swab performed by provider. RN assisted.

## 2017-11-19 NOTE — ED Provider Notes (Signed)
[facility]-EMERGENCY DEPT Provider Note   CSN: [id number] Arrival date & time: 11/19/17  1212     History   Chief Complaint Chief Complaint  Patient presents with  . Dysuria    HPI Pearly L Thalia PartyDearman is a 32 y.o. male presenting for evaluation of dysuria.  Patient states that the past 3 or 4 days, he has been having dysuria.  He denies penile swelling or testicular swelling.  He denies testicular pain.  He states this feels similar to when he has had previous STDs.  He denies urinary frequency or hematuria.  He denies fevers, chills, nausea, vomiting, abdominal pain.  He has no medical problems, takes no medications daily.  He reports he essentially active with one male partner, they do not always use condoms.  He states she was recently told she has a "bacterial infection" but he is not sure which one.  Additional history obtained from chart review.  Patient has been tested for STDs multiple times in the past, all recent testing was negative.  Last positive for chlamydia in 2014.  HPI  Past Medical History:  Diagnosis Date  . Bronchitis   . Chlamydia   . Gonorrhea     There are no active problems to display for this patient.   History reviewed. No pertinent surgical history.      Home Medications    Prior to Admission medications   Not on File    Family History Family History  Problem Relation Age of Onset  . Cancer Mother     Social History Social History   Tobacco Use  . Smoking status: Current Every Day Smoker    Packs/day: 0.50    Types: Cigarettes  . Smokeless tobacco: Never Used  Substance Use Topics  . Alcohol use: Yes    Alcohol/week: 2.0 standard drinks    Types: 2 Cans of beer per week  . Drug use: Yes    Types: Marijuana     Allergies   Patient has no known allergies.   Review of Systems Review of Systems  Genitourinary: Positive for dysuria. Negative for discharge, frequency, hematuria, penile swelling,  scrotal swelling and testicular pain.  All other systems reviewed and are negative.    Physical Exam Updated Vital Signs BP 119/72 (BP Location: Left Arm)   Pulse 70   Temp 98.8 F (37.1 C) (Oral)   Resp 15   Ht 6' (1.829 m)   Wt 72.6 kg   BMI 21.70 kg/m   Physical Exam  Constitutional: He is oriented to person, place, and time. He appears well-developed and well-nourished. No distress.  HENT:  Head: Normocephalic and atraumatic.  Eyes: EOM are normal.  Neck: Normal range of motion.  Pulmonary/Chest: Effort normal.  Abdominal: He exhibits no distension.  Genitourinary: Testes normal. Circumcised. No penile erythema or penile tenderness. No discharge found.  Genitourinary Comments: Chaperone present.  No obvious swelling noted of the penis or testicles.  No inguinal lymphadenopathy.  No penile discharge.  No sores.  No redness or irritation noted of the glands.  Musculoskeletal: Normal range of motion.  Lymphadenopathy: No inguinal adenopathy noted on the right or left side.  Neurological: He is alert and oriented to person, place, and time.  Skin: Skin is warm. No rash noted.  Psychiatric: He has a normal mood and affect.  Nursing note and vitals reviewed.    ED Treatments / Results  Labs (all labs ordered are listed, but only abnormal results are displayed) Labs  Reviewed  URINALYSIS, ROUTINE W REFLEX MICROSCOPIC  GC/CHLAMYDIA PROBE AMP ([place]) [facility]    EKG None  Radiology No results found.  Procedures Procedures (including critical care time)  Medications Ordered in ED Medications  cefTRIAXone (ROCEPHIN) injection 250 mg (has no administration in time range)  azithromycin (ZITHROMAX) tablet 1,000 mg (has no administration in time range)     Initial Impression / Assessment and Plan / ED Course  I have reviewed the triage vital signs and the nursing notes.  Pertinent labs & imaging results that were available during my care of the patient were  reviewed by me and considered in my medical decision making (see chart for details).     Patient presenting for evaluation of dysuria.  Physical exam reassuring, he is afebrile not tachycardic.  Appears nontoxic.  Doubt systemic infection.  GU exam without discharge, swelling, or tenderness.  Doubt torsion, balanitis.  UA sent to assess for UTI.  Gonorrhea and chlamydial culture sent.  Patient would like treatment today, as he is having symptoms and his partner was tested positive for a bacterial infection.  Azithromycin and Rocephin given.  UA negative for infection.  Discussed with patient that results are pending, but he was treated today.  Discussed follow-up for further evaluation of symptoms as needed.  At this time, patient appears safe for discharge.  Return precautions given.  Patient states he understands agrees plan.   Final Clinical Impressions(s) / ED Diagnoses   Final diagnoses:  Dysuria    ED Discharge Orders    None       Alveria ApleyCaccavale, Ian Castagna, PA-C 11/19/17 1341    Charlynne PanderYao, David Hsienta, MD 11/19/17 1536

## 2017-11-19 NOTE — ED Notes (Signed)
Bed: WTR9 Expected date:  Expected time:  Means of arrival:  Comments: 

## 2017-11-19 NOTE — Discharge Instructions (Addendum)
You were treated for gonorrhea and chlamydia today.  The cultures have been sent.  If they are positive, you will receive a phone call.  You do not need further treatment if they are positive.  If they are negative, you will not receive a phone call.  Either way, you may check online on mychart. Make sure you are staying well-hydrated with water. Return to the emergency room with any new, worsening, or concerning symptoms.

## 2017-11-19 NOTE — ED Triage Notes (Signed)
Pt c/o penile discomfort, burning with urination x 3-4 days. Denies discharge, N/V, fevers. Possible exposure of STD, hx of same with similar sx.

## 2017-11-20 LAB — GC/CHLAMYDIA PROBE AMP (~~LOC~~) NOT AT ARMC
Chlamydia: NEGATIVE
Neisseria Gonorrhea: NEGATIVE

## 2018-01-21 ENCOUNTER — Encounter (HOSPITAL_COMMUNITY): Payer: Self-pay | Admitting: Emergency Medicine

## 2018-01-21 ENCOUNTER — Emergency Department (HOSPITAL_COMMUNITY)
Admission: EM | Admit: 2018-01-21 | Discharge: 2018-01-21 | Disposition: A | Discharge status: Home / Self Care | Payer: Self-pay | Attending: Emergency Medicine | Admitting: Emergency Medicine

## 2018-01-21 DIAGNOSIS — Z202 Contact with and (suspected) exposure to infections with a predominantly sexual mode of transmission: Secondary | ICD-10-CM | POA: Insufficient documentation

## 2018-01-21 DIAGNOSIS — R309 Painful micturition, unspecified: Secondary | ICD-10-CM | POA: Insufficient documentation

## 2018-01-21 DIAGNOSIS — N4889 Other specified disorders of penis: Secondary | ICD-10-CM | POA: Insufficient documentation

## 2018-01-21 LAB — URINALYSIS, ROUTINE W REFLEX MICROSCOPIC
BILIRUBIN URINE: NEGATIVE
Glucose, UA: NEGATIVE mg/dL
HGB URINE DIPSTICK: NEGATIVE
Ketones, ur: NEGATIVE mg/dL
Leukocytes, UA: NEGATIVE
Nitrite: NEGATIVE
PH: 7 (ref 5.0–8.0)
Protein, ur: NEGATIVE mg/dL
SPECIFIC GRAVITY, URINE: 1.014 (ref 1.005–1.030)

## 2018-01-21 MED ORDER — AZITHROMYCIN 1 G PO PACK
1.0000 g | PACK | Freq: Once | ORAL | Status: AC
  Administered 2018-01-21: 1 g via ORAL
  Filled 2018-01-21: qty 1

## 2018-01-21 MED ORDER — CEFTRIAXONE SODIUM 250 MG IJ SOLR
250.0000 mg | Freq: Once | INTRAMUSCULAR | Status: AC
  Administered 2018-01-21: 250 mg via INTRAMUSCULAR
  Filled 2018-01-21: qty 250

## 2018-01-21 MED ORDER — LIDOCAINE HCL 1 % IJ SOLN
INTRAMUSCULAR | Status: AC
  Administered 2018-01-21: 10:00:00
  Filled 2018-01-21: qty 20

## 2018-01-21 NOTE — ED Triage Notes (Signed)
Pt reports that girlfriend was treated for infection and told him that he needs to be treated as well. Reports intermittent penile pain for past couple days. Denies discharge.

## 2018-01-21 NOTE — Discharge Instructions (Addendum)
You were evaluated in the Emergency Department and after careful evaluation, we did not find any emergent condition requiring admission or further testing in the hospital.  Your symptoms today seem to be due to exposure to an STD.  We have treated you here in the ED.  We will call you back with any abnormal lab results.  Please return to the Emergency Department if you experience any worsening of your condition.  We encourage you to follow up with a primary care provider.  Thank you for allowing [place] to be a part of your care.

## 2018-01-21 NOTE — ED Provider Notes (Signed)
[facility] Emergency Department Provider Note MRN:  [id number]  Arrival date & time: 01/21/18     Chief Complaint   Penis Pain   History of Present Illness   Larry Alvarez is a 32 y.o. year-old male with a history of STDs presenting to the ED with chief complaint of penis pain.  Patient is here because he was told by his girlfriend that she was diagnosed with an STD.  Was instructed by his girlfriend to come here to be treated.  Patient does report intermittent burning with urination for the past 2 to 3 days.  No penile discharge, no fevers.  No abdominal pain, no other symptoms.  Review of Systems  A problem-focused ROS was performed. Positive for dysuria.  Patient denies fever. Patient's Health History    Past Medical History:  Diagnosis Date  . Bronchitis   . Chlamydia   . Gonorrhea     History reviewed. No pertinent surgical history.  Family History  Problem Relation Age of Onset  . Cancer Mother     Social History   Socioeconomic History  . Marital status: Single    Spouse name: Not on file  . Number of children: Not on file  . Years of education: Not on file  . Highest education level: Not on file  Occupational History  . Not on file  Social Needs  . Financial resource strain: Not on file  . Food insecurity:    Worry: Not on file    Inability: Not on file  . Transportation needs:    Medical: Not on file    Non-medical: Not on file  Tobacco Use  . Smoking status: Current Every Day Smoker    Packs/day: 0.50    Types: Cigarettes  . Smokeless tobacco: Never Used  Substance and Sexual Activity  . Alcohol use: Yes    Alcohol/week: 2.0 standard drinks    Types: 2 Cans of beer per week  . Drug use: Yes    Types: Marijuana  . Sexual activity: Not on file  Lifestyle  . Physical activity:    Days per week: Not on file    Minutes per session: Not on file  . Stress: Not on file  Relationships  . Social connections:    Talks on  phone: Not on file    Gets together: Not on file    Attends religious service: Not on file    Active member of club or organization: Not on file    Attends meetings of clubs or organizations: Not on file    Relationship status: Not on file  . Intimate partner violence:    Fear of current or ex partner: Not on file    Emotionally abused: Not on file    Physically abused: Not on file    Forced sexual activity: Not on file  Other Topics Concern  . Not on file  Social History Narrative  . Not on file     Physical Exam  Vital Signs and Nursing Notes reviewed Vitals:   01/21/18 0933  BP: 129/75  Pulse: 61  Resp: 15  Temp: 97.9 F (36.6 C)  SpO2: 100%    CONSTITUTIONAL: Well-appearing, NAD NEURO:  Alert and oriented x 3, no focal deficits EYES:  eyes equal and reactive ENT/NECK:  no LAD, no JVD CARDIO: Regular rate, well-perfused, normal S1 and S2 PULM:  CTAB no wheezing or rhonchi GI/GU:  normal bowel sounds, non-distended, non-tender; normal appearing external genitalia, no penile  discharge or tenderness, no scrotal or testicular tenderness or swelling, single 1 cm enlarged right inguinal lymph node MSK/SPINE:  No gross deformities, no edema SKIN:  no rash, atraumatic PSYCH:  Appropriate speech and behavior  Diagnostic and Interventional Summary    Labs Reviewed  URINALYSIS, ROUTINE W REFLEX MICROSCOPIC - Abnormal; Notable for the following components:      Result Value   Color, Urine STRAW (*)    All other components within normal limits  HIV ANTIBODY (ROUTINE TESTING W REFLEX)  RPR  GC/CHLAMYDIA PROBE AMP ([place]) NOT AT [facility]    No orders to display    Medications  cefTRIAXone (ROCEPHIN) injection 250 mg (250 mg Intramuscular Given 01/21/18 0953)  azithromycin (ZITHROMAX) powder 1 g (1 g Oral Given 01/21/18 0957)  lidocaine (XYLOCAINE) 1 % (with pres) injection (  Given 01/21/18 1001)     Procedures Critical Care  ED Course and Medical Decision Making    I have reviewed the triage vital signs and the nursing notes.  Pertinent labs & imaging results that were available during my care of the patient were reviewed by me and considered in my medical decision making (see below for details).  Patient does not know what infection he was exposed to, will screen for all STDs and treat with azithromycin, ceftriaxone.  No trichomoniasis found on urinalysis, RPR and HIV antibody pending.  After the discussed management above, the patient was determined to be safe for discharge.  The patient was in agreement with this plan and all questions regarding their care were answered.  ED return precautions were discussed and the patient will return to the ED with any significant worsening of condition.  Elmer Sow. Pilar Plate, MD [facility] Emergency Medicine [facility] [email or web address]  Final Clinical Impressions(s) / ED Diagnoses     ICD-10-CM   1. STD exposure Z20.2     ED Discharge Orders    None         Sabas Sous, MD 01/21/18 1038

## 2018-01-22 LAB — HIV ANTIBODY (ROUTINE TESTING W REFLEX): HIV Screen 4th Generation wRfx: NONREACTIVE

## 2018-01-22 LAB — GC/CHLAMYDIA PROBE AMP (~~LOC~~) NOT AT ARMC
CHLAMYDIA, DNA PROBE: POSITIVE — AB
NEISSERIA GONORRHEA: NEGATIVE

## 2018-01-22 LAB — RPR: RPR Ser Ql: NONREACTIVE

## 2018-02-08 ENCOUNTER — Emergency Department (HOSPITAL_COMMUNITY)
Admission: EM | Admit: 2018-02-08 | Discharge: 2018-02-08 | Disposition: A | Discharge status: Home / Self Care | Payer: Self-pay | Attending: Emergency Medicine | Admitting: Emergency Medicine

## 2018-02-08 ENCOUNTER — Encounter (HOSPITAL_COMMUNITY): Payer: Self-pay | Admitting: Emergency Medicine

## 2018-02-08 ENCOUNTER — Other Ambulatory Visit: Payer: Self-pay

## 2018-02-08 DIAGNOSIS — Z711 Person with feared health complaint in whom no diagnosis is made: Secondary | ICD-10-CM

## 2018-02-08 DIAGNOSIS — F17210 Nicotine dependence, cigarettes, uncomplicated: Secondary | ICD-10-CM | POA: Insufficient documentation

## 2018-02-08 DIAGNOSIS — Z202 Contact with and (suspected) exposure to infections with a predominantly sexual mode of transmission: Secondary | ICD-10-CM | POA: Insufficient documentation

## 2018-02-08 DIAGNOSIS — N4889 Other specified disorders of penis: Secondary | ICD-10-CM | POA: Insufficient documentation

## 2018-02-08 MED ORDER — LIDOCAINE HCL (PF) 1 % IJ SOLN
INTRAMUSCULAR | Status: AC
  Administered 2018-02-08: 5 mL
  Filled 2018-02-08: qty 5

## 2018-02-08 MED ORDER — AZITHROMYCIN 250 MG PO TABS
1000.0000 mg | ORAL_TABLET | Freq: Once | ORAL | Status: AC
Start: 2018-02-08 — End: 2018-02-08
  Administered 2018-02-08: 1000 mg via ORAL
  Filled 2018-02-08: qty 4

## 2018-02-08 MED ORDER — CEFTRIAXONE SODIUM 250 MG IJ SOLR
250.0000 mg | Freq: Once | INTRAMUSCULAR | Status: AC
  Administered 2018-02-08: 250 mg via INTRAMUSCULAR
  Filled 2018-02-08: qty 250

## 2018-02-08 NOTE — ED Provider Notes (Signed)
[facility] EMERGENCY DEPARTMENT Provider Note   CSN: [id number] Arrival date & time: 02/08/18  1017     History   Chief Complaint No chief complaint on file.   HPI Larry Alvarez is a 32 y.o. male.  32 year old male presents for STD testing.  Patient states that he woke up today with discomfort in his penis similar to when he had an STD 2 weeks ago.  Patient was seen in the emergency room 2 weeks ago, tested positive for chlamydia and was treated with Rocephin and Zithromax.  Patient reports vaginal intercourse with one male partner, states that she is a new partner, reports using a condom but "it fell off."  Patient denies active discharge, painful or frequent urination, pain or swelling in his testicles, lesions of the skin or sores.  No other complaints or concerns.     Past Medical History:  Diagnosis Date  . Bronchitis   . Chlamydia   . Gonorrhea     There are no active problems to display for this patient.   No past surgical history on file.      Home Medications    Prior to Admission medications   Medication Sig Start Date End Date Taking? Authorizing Provider  acetaminophen (TYLENOL) 500 MG tablet Take 1,000 mg by mouth every 6 (six) hours as needed for mild pain.    [provider]    Family History Family History  Problem Relation Age of Onset  . Cancer Mother     Social History Social History   Tobacco Use  . Smoking status: Current Every Day Smoker    Packs/day: 0.50    Types: Cigarettes  . Smokeless tobacco: Never Used  Substance Use Topics  . Alcohol use: Yes    Alcohol/week: 2.0 standard drinks    Types: 2 Cans of beer per week  . Drug use: Yes    Types: Marijuana     Allergies   Patient has no known allergies.   Review of Systems Review of Systems  Constitutional: Negative for fever.  Gastrointestinal: Negative for abdominal pain.  Genitourinary: Positive for penile pain. Negative for  discharge, dysuria, frequency, genital sores, penile swelling, scrotal swelling and testicular pain.  Skin: Negative for color change, rash and wound.  Allergic/Immunologic: Negative for immunocompromised state.  Neurological: Negative for weakness.  Hematological: Negative for adenopathy.  All other systems reviewed and are negative.    Physical Exam Updated Vital Signs BP 133/79 (BP Location: Right Arm)   Pulse 68   Temp 98.5 F (36.9 C) (Oral)   Resp 18   SpO2 100%   Physical Exam  Constitutional: He is oriented to person, place, and time. He appears well-developed and well-nourished. No distress.  HENT:  Head: Normocephalic and atraumatic.  Pulmonary/Chest: Effort normal.  Genitourinary: Circumcised. No penile tenderness. No discharge found.  Genitourinary Comments: Chaperone present for exam and specimen collection.  Neurological: He is alert and oriented to person, place, and time.  Skin: Skin is warm and dry. No rash noted. He is not diaphoretic. No erythema.  Psychiatric: He has a normal mood and affect. His behavior is normal.  Nursing note and vitals reviewed.    ED Treatments / Results  Labs (all labs ordered are listed, but only abnormal results are displayed) Labs Reviewed  GC/CHLAMYDIA PROBE AMP ([place]) NOT AT [facility]    EKG None  Radiology No results found.  Procedures Procedures (including critical care time)  Medications Ordered in ED  Medications  cefTRIAXone (ROCEPHIN) injection 250 mg (has no administration in time range)  azithromycin (ZITHROMAX) tablet 1,000 mg (has no administration in time range)     Initial Impression / Assessment and Plan / ED Course  I have reviewed the triage vital signs and the nursing notes.  Pertinent labs & imaging results that were available during my care of the patient were reviewed by me and considered in my medical decision making (see chart for details).  Clinical Course as of Feb 09 1119  Sat Feb 09, 1816  2629 32 year old male presents for STD testing and treatment.  Patient reports discomfort in his penis today similar to when he had an STD and was treated for same 2 weeks ago.  Review of previous lab work, patient was positive for chlamydia, he was treated with Rocephin and Zithromax, his RPR and HIV testing were negative.  Patient will be treated today with Rocephin and Zithromax as well.  Advised to abstain from intercourse, advised all partners need to be tested and/or treated and abstain x10 days as well.   [LM]    Clinical Course User Index [LM] Jeannie Fend, PA-C   Final Clinical Impressions(s) / ED Diagnoses   Final diagnoses:  Concern about STD in male without diagnosis    ED Discharge Orders    None       Alden Hipp 02/08/18 1121    Tilden Fossa, MD 02/09/18 0745

## 2018-02-08 NOTE — Discharge Instructions (Signed)
Abstain from intercourse until you know your test results. Call the hospital in 3-5 days for your results. IF your gonorrhea and chlamydia tests are negative, you may resume intercourse. IF either test is positive, your partner will need to go to the health department for free treatment. IF your test is positive, both partners need to abstain from intercourse for 10 days after treatment (this includes all forms of intercourse).  ° °

## 2018-02-08 NOTE — ED Triage Notes (Signed)
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 Him and his gf were treated on the 15th of October. He had sex a few days ago and 2 days after began having burning in his penis. No discharge.

## 2018-02-10 LAB — GC/CHLAMYDIA PROBE AMP (~~LOC~~) NOT AT ARMC
CHLAMYDIA, DNA PROBE: POSITIVE — AB
NEISSERIA GONORRHEA: NEGATIVE

## 2018-03-31 ENCOUNTER — Emergency Department (HOSPITAL_COMMUNITY)
Admission: EM | Admit: 2018-03-31 | Discharge: 2018-03-31 | Disposition: A | Discharge status: Home / Self Care | Payer: Self-pay | Attending: Emergency Medicine | Admitting: Emergency Medicine

## 2018-03-31 ENCOUNTER — Other Ambulatory Visit: Payer: Self-pay

## 2018-03-31 DIAGNOSIS — J020 Streptococcal pharyngitis: Secondary | ICD-10-CM | POA: Insufficient documentation

## 2018-03-31 DIAGNOSIS — F17210 Nicotine dependence, cigarettes, uncomplicated: Secondary | ICD-10-CM | POA: Insufficient documentation

## 2018-03-31 LAB — INFLUENZA PANEL BY PCR (TYPE A & B)
INFLAPCR: NEGATIVE
INFLBPCR: NEGATIVE

## 2018-03-31 LAB — GROUP A STREP BY PCR: Group A Strep by PCR: DETECTED — AB

## 2018-03-31 MED ORDER — ACETAMINOPHEN 325 MG PO TABS
650.0000 mg | ORAL_TABLET | Freq: Once | ORAL | Status: AC | PRN
  Administered 2018-03-31: 650 mg via ORAL
  Filled 2018-03-31: qty 2

## 2018-03-31 MED ORDER — PENICILLIN G BENZATHINE 1200000 UNIT/2ML IM SUSP
1.2000 10*6.[IU] | Freq: Once | INTRAMUSCULAR | Status: AC
  Administered 2018-03-31: 1.2 10*6.[IU] via INTRAMUSCULAR
  Filled 2018-03-31: qty 2

## 2018-03-31 MED ORDER — DEXAMETHASONE SODIUM PHOSPHATE 10 MG/ML IJ SOLN
10.0000 mg | Freq: Once | INTRAMUSCULAR | Status: AC
  Administered 2018-03-31: 10 mg via INTRAMUSCULAR
  Filled 2018-03-31: qty 1

## 2018-03-31 NOTE — Discharge Instructions (Addendum)
1. Medications: Alternate Tylenol and ibuprofen for fever control and pain control, usual home medications 2. Treatment: rest, drink plenty of fluids,  3. Follow Up: Please followup with your primary doctor in 2-3 days for discussion of your diagnoses and further evaluation after today's visit; if you do not have a primary care doctor use the resource guide provided to find one; Please return to the ER for difficulty swallowing, persistent fevers, vomiting or other concerns.

## 2018-03-31 NOTE — ED Triage Notes (Signed)
Patient is complaining of fever, body aches, sore throat, and congestion. Patient states that this started last night.

## 2018-03-31 NOTE — ED Provider Notes (Signed)
[facility]-EMERGENCY DEPT Provider Note   CSN: [id number] Arrival date & time: 03/31/18  [id number]     History   Chief Complaint Chief Complaint  Patient presents with  . Fever  . Sore Throat  . Generalized Body Aches    HPI Larry Alvarez is a 32 y.o. male with a hx of bronchitis, chlamydia and gonorrhea presents to the Emergency Department complaining of gradual, persistent, progressively worsening sore throat with associated fever, body aches, nasal congestion onset last night.  Patient reports taking Advil without relief.  He denies known sick contacts.  Patient reports coughing and swallowing make his throat worse.  Nothing seems to make it better.  Patient reports he is able to eat and drink however it does significantly worsen his pain.  Patient denies voice changes.  The history is provided by the patient and medical records. No language interpreter was used.    Past Medical History:  Diagnosis Date  . Bronchitis   . Chlamydia   . Gonorrhea     There are no active problems to display for this patient.   No past surgical history on file.      Home Medications    Prior to Admission medications   Medication Sig Start Date End Date Taking? Authorizing Provider  acetaminophen (TYLENOL) 500 MG tablet Take 1,000 mg by mouth every 6 (six) hours as needed for mild pain.    [provider]    Family History Family History  Problem Relation Age of Onset  . Cancer Mother     Social History Social History   Tobacco Use  . Smoking status: Current Every Day Smoker    Packs/day: 0.50    Types: Cigarettes  . Smokeless tobacco: Never Used  Substance Use Topics  . Alcohol use: Yes    Alcohol/week: 2.0 standard drinks    Types: 2 Cans of beer per week  . Drug use: Yes    Types: Marijuana     Allergies   Patient has no known allergies.   Review of Systems Review of Systems  Constitutional: Positive for chills and fever.  Negative for appetite change, diaphoresis, fatigue and unexpected weight change.  HENT: Positive for congestion and sore throat. Negative for mouth sores.   Eyes: Negative for visual disturbance.  Respiratory: Negative for cough, chest tightness, shortness of breath and wheezing.   Cardiovascular: Negative for chest pain.  Gastrointestinal: Negative for abdominal pain, constipation, diarrhea, nausea and vomiting.  Endocrine: Negative for polydipsia, polyphagia and polyuria.  Genitourinary: Negative for dysuria, frequency, hematuria and urgency.  Musculoskeletal: Positive for myalgias. Negative for arthralgias, back pain and neck stiffness.  Skin: Negative for rash.  Allergic/Immunologic: Negative for immunocompromised state.  Neurological: Negative for syncope, light-headedness and headaches.  Hematological: Does not bruise/bleed easily.  Psychiatric/Behavioral: Negative for sleep disturbance. The patient is not nervous/anxious.      Physical Exam Updated Vital Signs BP (!) 127/54   Pulse 76   Temp (!) 102 F (38.9 C) (Oral)   Resp 14   Ht 6' (1.829 m)   Wt 72.6 kg   SpO2 100%   BMI 21.70 kg/m   Physical Exam Vitals signs and nursing note reviewed.  Constitutional:      General: He is not in acute distress.    Appearance: He is well-developed. He is not diaphoretic.  HENT:     Head: Normocephalic and atraumatic.     Jaw: No trismus.     Right Ear: Tympanic  membrane, ear canal and external ear normal.     Left Ear: Tympanic membrane, ear canal and external ear normal.     Nose: Mucosal edema present. No rhinorrhea.     Mouth/Throat:     Mouth: Mucous membranes are not dry.     Pharynx: Uvula midline. Oropharyngeal exudate and posterior oropharyngeal erythema present. No uvula swelling.     Tonsils: Tonsillar exudate present. No tonsillar abscesses. Swelling: 1+ on the right. 1+ on the left.  Eyes:     Conjunctiva/sclera: Conjunctivae normal.  Neck:     Musculoskeletal:  Full passive range of motion without pain and normal range of motion. Normal range of motion. No erythema, neck rigidity, spinous process tenderness or muscular tenderness.     Trachea: Phonation normal. No tracheal tenderness.     Meningeal: Brudzinski's sign and Kernig's sign absent.     Comments: Range of motion without pain  No midline or paraspinal tenderness Normal phonation No stridor Handling secretions without difficulty No nuchal rigidity or meningeal signs Cardiovascular:     Rate and Rhythm: Normal rate and regular rhythm.     Pulses:          Radial pulses are 2+ on the right side and 2+ on the left side.     Heart sounds: Normal heart sounds.  Pulmonary:     Effort: Pulmonary effort is normal. No respiratory distress.     Breath sounds: Normal breath sounds. No stridor. No decreased breath sounds or wheezing.  Musculoskeletal: Normal range of motion.  Lymphadenopathy:     Head:     Right side of head: Submandibular and tonsillar adenopathy present. No submental, preauricular, posterior auricular or occipital adenopathy.     Left side of head: Submandibular and tonsillar adenopathy present. No submental, preauricular, posterior auricular or occipital adenopathy.     Cervical: Cervical adenopathy present.     Right cervical: Superficial cervical adenopathy present. No deep or posterior cervical adenopathy.    Left cervical: Superficial cervical adenopathy present. No deep or posterior cervical adenopathy.  Skin:    General: Skin is warm and dry.  Neurological:     Mental Status: He is alert.     Comments: Alert and oriented Moves all extremities without ataxia      ED Treatments / Results  Labs (all labs ordered are listed, but only abnormal results are displayed) Labs Reviewed  GROUP A STREP BY PCR - Abnormal; Notable for the following components:      Result Value   Group A Strep by PCR DETECTED (*)    All other components within normal limits  INFLUENZA PANEL  BY PCR (TYPE A & B)    EKG None  Radiology No results found.  Procedures Procedures (including critical care time)  Medications Ordered in ED Medications  acetaminophen (TYLENOL) tablet 650 mg (650 mg Oral Given 03/31/18 0406)  penicillin g benzathine (BICILLIN LA) 1200000 UNIT/2ML injection 1.2 Million Units (1.2 Million Units Intramuscular Given 03/31/18 0513)  dexamethasone (DECADRON) injection 10 mg (10 mg Intramuscular Given 03/31/18 0513)     Initial Impression / Assessment and Plan / ED Course  I have reviewed the triage vital signs and the nursing notes.  Pertinent labs & imaging results that were available during my care of the patient were reviewed by me and considered in my medical decision making (see chart for details).     Pt febrile with tonsillar exudate, cervical lymphadenopathy, & dysphagia; diagnosis of bacterial pharyngitis. Strep test positive.  Treated in the ED with steroids, tylenol and PCN IM.  Pt appears mildly dehydrated, discussed importance of water rehydration. He is able to drink in the ED without difficulty.  Presentation non concerning for PTA or RPA. No trismus or uvula deviation. Specific return precautions discussed. Pt able to drink water in ED without difficulty with intact air way. Recommended PCP follow up.      Final Clinical Impressions(s) / ED Diagnoses   Final diagnoses:  Strep pharyngitis    ED Discharge Orders    None       Mardene SayerMuthersbaugh, Boyd KerbsHannah, PA-C 03/31/18 0551    Molpus, Jonny RuizJohn, MD 03/31/18 [phone]

## 2018-09-20 ENCOUNTER — Other Ambulatory Visit: Payer: Self-pay

## 2018-09-20 ENCOUNTER — Emergency Department (HOSPITAL_COMMUNITY)
Admission: EM | Admit: 2018-09-20 | Discharge: 2018-09-20 | Disposition: A | Discharge status: Home / Self Care | Payer: Self-pay | Attending: Emergency Medicine | Admitting: Emergency Medicine

## 2018-09-20 ENCOUNTER — Encounter (HOSPITAL_COMMUNITY): Payer: Self-pay | Admitting: Emergency Medicine

## 2018-09-20 DIAGNOSIS — L0390 Cellulitis, unspecified: Secondary | ICD-10-CM

## 2018-09-20 DIAGNOSIS — S70361A Insect bite (nonvenomous), right thigh, initial encounter: Secondary | ICD-10-CM | POA: Insufficient documentation

## 2018-09-20 DIAGNOSIS — W57XXXA Bitten or stung by nonvenomous insect and other nonvenomous arthropods, initial encounter: Secondary | ICD-10-CM | POA: Insufficient documentation

## 2018-09-20 DIAGNOSIS — Y939 Activity, unspecified: Secondary | ICD-10-CM | POA: Insufficient documentation

## 2018-09-20 DIAGNOSIS — Y929 Unspecified place or not applicable: Secondary | ICD-10-CM | POA: Insufficient documentation

## 2018-09-20 DIAGNOSIS — Y999 Unspecified external cause status: Secondary | ICD-10-CM | POA: Insufficient documentation

## 2018-09-20 DIAGNOSIS — L03115 Cellulitis of right lower limb: Secondary | ICD-10-CM | POA: Insufficient documentation

## 2018-09-20 DIAGNOSIS — F17210 Nicotine dependence, cigarettes, uncomplicated: Secondary | ICD-10-CM | POA: Insufficient documentation

## 2018-09-20 MED ORDER — DOXYCYCLINE HYCLATE 100 MG PO CAPS: 100.0000 mg | ORAL_CAPSULE | Freq: Two times a day (BID) | ORAL | 0 refills | Status: AC

## 2018-09-20 NOTE — ED Notes (Signed)
Bed: PF29 Expected date:  Expected time:  Means of arrival:  Comments: HOLD

## 2018-09-20 NOTE — ED Notes (Signed)
Pt c/o bug bit on right upper thigh for several days. Reports he popped it and got pus out couple days ago. Reports is painful and today got blood out of it.

## 2018-09-20 NOTE — ED Provider Notes (Signed)
[facility] DEPT Provider Note   CSN: [id number] Arrival date & time: 09/20/18  1336    History   Chief Complaint Chief Complaint  Patient presents with  . Insect Bite    right thigh    HPI Larry Alvarez is a 33 y.o. male.     HPI   Patient is a 33 year old male with a history of bronchitis, chlamydia, gonorrhea, who presents the emergency department today due to concerns for possible insect bite.  States a few days ago he noticed a red area to his right thigh.  He had some pus drained out of it a few days ago and since then he has had a mild amount of blood come out.  He has had no known fevers at home.  No other systemic symptoms.  Has not tried any interventions.  Past Medical History:  Diagnosis Date  . Bronchitis   . Chlamydia   . Gonorrhea     There are no active problems to display for this patient.   History reviewed. No pertinent surgical history.      Home Medications    Prior to Admission medications   Medication Sig Start Date End Date Taking? Authorizing Provider  acetaminophen (TYLENOL) 500 MG tablet Take 1,000 mg by mouth every 6 (six) hours as needed for mild pain.    [provider]  doxycycline (VIBRAMYCIN) 100 MG capsule Take 1 capsule (100 mg total) by mouth 2 (two) times daily for 7 days. 09/20/18 09/27/18  Laylamarie Meuser S, PA-C    Family History Family History  Problem Relation Age of Onset  . Cancer Mother     Social History Social History   Tobacco Use  . Smoking status: Current Every Day Smoker    Packs/day: 0.50    Types: Cigarettes  . Smokeless tobacco: Never Used  Substance Use Topics  . Alcohol use: Yes    Alcohol/week: 2.0 standard drinks    Types: 2 Cans of beer per week  . Drug use: Yes    Types: Marijuana     Allergies   Patient has no known allergies.   Review of Systems Review of Systems  Constitutional: Negative for fever.  Skin: Positive for color change.     Possible insect bite     Physical Exam Updated Vital Signs BP 124/68 (BP Location: Left Arm)   Pulse 85   Temp 99.1 F (37.3 C) (Oral)   Resp 16   SpO2 97%   Physical Exam Constitutional:      General: He is not in acute distress.    Appearance: He is well-developed.  Eyes:     Conjunctiva/sclera: Conjunctivae normal.  Cardiovascular:     Rate and Rhythm: Normal rate.  Pulmonary:     Effort: Pulmonary effort is normal.  Musculoskeletal:     Comments: 5cm x 5cm area of erythema, induration and warmth to the right thigh. There is a central punctate area that is actively drainage a small amount of seropurulent drainage. I do not feel a large area of fluctuance. Mild TTP.  Skin:    General: Skin is warm and dry.  Neurological:     Mental Status: He is alert and oriented to person, place, and time.      ED Treatments / Results  Labs (all labs ordered are listed, but only abnormal results are displayed) Labs Reviewed - No data to display  EKG None  Radiology No results found.  Procedures Procedures (including critical  care time)  Medications Ordered in ED Medications - No data to display   Initial Impression / Assessment and Plan / ED Course  I have reviewed the triage vital signs and the nursing notes.  Pertinent labs & imaging results that were available during my care of the patient were reviewed by me and considered in my medical decision making (see chart for details).   Final Clinical Impressions(s) / ED Diagnoses   Final diagnoses:  Cellulitis, unspecified cellulitis site   Patient is a 33 year old male presenting to the ED for concern for possible insect bite. Has redness and pus drainage from a wound on his thigh. No fevers. Nontoxic, nonseptic appearing.   5cm x 5cm area of erythema, induration and warmth to the right thigh. There is a central punctate area that is actively drainage a small amount of seropurulent drainage. I do not feel a large  area of fluctuance that would require drainage. Mild TTP.  I drew a border around the area and will give the pt an rx for doxycycline. Advised to return to the ED if redness progresses or sxs progress over the next 24-48 hours. He voices understanding and is in agreement with plan. All questions answered. Pt stable for d/c.  ED Discharge Orders         Ordered    doxycycline (VIBRAMYCIN) 100 MG capsule  2 times daily     09/20/18 [address]1450           Rollande Thursby S, PA-C 09/20/18 1453    Jacalyn LefevreHaviland, Julie, MD 09/20/18 1546

## 2018-09-20 NOTE — Discharge Instructions (Signed)
You were given a prescription for antibiotics. Please take the antibiotic prescription fully.  ° °Please follow up with your primary care provider within 3-5 days for re-evaluation of your symptoms. If you do not have a primary care provider, information for a healthcare clinic has been provided for you to make arrangements for follow up care. ° °Please return to the emergency room immediately if you experience any new or worsening symptoms or any symptoms that indicate worsening infection such as fevers, increased redness/swelling/pain, warmth, or drainage from the affected area.  ° °

## 2019-04-07 ENCOUNTER — Encounter (HOSPITAL_COMMUNITY): Payer: Self-pay

## 2019-04-07 ENCOUNTER — Emergency Department (HOSPITAL_COMMUNITY)
Admission: EM | Admit: 2019-04-07 | Discharge: 2019-04-07 | Disposition: A | Discharge status: Home / Self Care | Payer: Self-pay | Attending: Emergency Medicine | Admitting: Emergency Medicine

## 2019-04-07 ENCOUNTER — Other Ambulatory Visit: Payer: Self-pay

## 2019-04-07 DIAGNOSIS — Z202 Contact with and (suspected) exposure to infections with a predominantly sexual mode of transmission: Secondary | ICD-10-CM | POA: Insufficient documentation

## 2019-04-07 DIAGNOSIS — R300 Dysuria: Secondary | ICD-10-CM | POA: Insufficient documentation

## 2019-04-07 DIAGNOSIS — F17210 Nicotine dependence, cigarettes, uncomplicated: Secondary | ICD-10-CM | POA: Insufficient documentation

## 2019-04-07 LAB — URINALYSIS, ROUTINE W REFLEX MICROSCOPIC
Bilirubin Urine: NEGATIVE
Glucose, UA: NEGATIVE mg/dL
Hgb urine dipstick: NEGATIVE
Ketones, ur: NEGATIVE mg/dL
Leukocytes,Ua: NEGATIVE
Nitrite: NEGATIVE
Protein, ur: NEGATIVE mg/dL
Specific Gravity, Urine: 1.024 (ref 1.005–1.030)
pH: 6 (ref 5.0–8.0)

## 2019-04-07 MED ORDER — AZITHROMYCIN 250 MG PO TABS
1000.0000 mg | ORAL_TABLET | Freq: Once | ORAL | Status: AC
  Administered 2019-04-07: 1000 mg via ORAL
  Filled 2019-04-07: qty 4

## 2019-04-07 MED ORDER — CEFTRIAXONE SODIUM 250 MG IJ SOLR
250.0000 mg | Freq: Once | INTRAMUSCULAR | Status: AC
  Administered 2019-04-07: 250 mg via INTRAMUSCULAR
  Filled 2019-04-07: qty 250

## 2019-04-07 MED ORDER — STERILE WATER FOR INJECTION IJ SOLN
INTRAMUSCULAR | Status: AC
  Administered 2019-04-07: 10 mL
  Filled 2019-04-07: qty 10

## 2019-04-07 NOTE — ED Provider Notes (Addendum)
[facility] DEPT Provider Note   CSN: [id number] Arrival date & time: 04/07/19  1233     History Chief Complaint  Patient presents with  . Dysuria    Larry Alvarez is a 33 y.o. male.  33 y.o male with a PMH of Chlamydia, Gonorrhea presents to the ED with a chief complaint of dysuria x 2 days. Patient was last sexually active a few days ago, unprotected.  He reports there is burning, there is no testicular swelling, fevers, testicular pain or penile discharge that he is aware of.  He does report having a prior history of chlamydia, reports his symptoms feel just like it.  No other complaints.  The history is provided by the patient.  Dysuria Presenting symptoms: dysuria   Presenting symptoms: no penile discharge and no penile pain   Associated symptoms: no fever, no hematuria, no penile swelling and no scrotal swelling        Past Medical History:  Diagnosis Date  . Bronchitis   . Chlamydia   . Gonorrhea     There are no problems to display for this patient.   History reviewed. No pertinent surgical history.     Family History  Problem Relation Age of Onset  . Cancer Mother     Social History   Tobacco Use  . Smoking status: Current Every Day Smoker    Packs/day: 0.50    Types: Cigarettes  . Smokeless tobacco: Never Used  Substance Use Topics  . Alcohol use: Yes    Alcohol/week: 2.0 standard drinks    Types: 2 Cans of beer per week  . Drug use: Yes    Types: Marijuana    Home Medications Prior to Admission medications   Medication Sig Start Date End Date Taking? Authorizing Provider  acetaminophen (TYLENOL) 500 MG tablet Take 1,000 mg by mouth every 6 (six) hours as needed for mild pain.    [provider]    Allergies    Patient has no known allergies.  Review of Systems   Review of Systems  Constitutional: Negative for fever.  Genitourinary: Positive for dysuria. Negative for discharge, hematuria,  penile pain, penile swelling, scrotal swelling, testicular pain and urgency.    Physical Exam Updated Vital Signs BP 125/75 (BP Location: Left Arm)   Pulse 70   Temp 98.4 F (36.9 C) (Oral)   Resp 18   SpO2 99%   Physical Exam Vitals and nursing note reviewed.  Constitutional:      Appearance: Normal appearance.  HENT:     Head: Normocephalic and atraumatic.     Nose: Nose normal.     Mouth/Throat:     Mouth: Mucous membranes are moist.  Cardiovascular:     Rate and Rhythm: Normal rate.  Pulmonary:     Effort: Pulmonary effort is normal.  Abdominal:     General: Abdomen is flat.  Genitourinary:    Comments: Patient deferred.  Musculoskeletal:     Cervical back: Normal range of motion and neck supple.  Skin:    General: Skin is warm and dry.  Neurological:     Mental Status: He is alert and oriented to person, place, and time.     ED Results / Procedures / Treatments   Labs (all labs ordered are listed, but only abnormal results are displayed) Labs Reviewed  URINALYSIS, ROUTINE W REFLEX MICROSCOPIC  GC/CHLAMYDIA PROBE AMP ([place]) NOT AT [facility]    EKG None  Radiology No results found.  Procedures Procedures (including critical care time)  Medications Ordered in ED Medications  cefTRIAXone (ROCEPHIN) injection 250 mg (has no administration in time range)  azithromycin (ZITHROMAX) tablet 1,000 mg (has no administration in time range)    ED Course  I have reviewed the triage vital signs and the nursing notes.  Pertinent labs & imaging results that were available during my care of the patient were reviewed by me and considered in my medical decision making (see chart for details).    MDM Rules/Calculators/A&P  Patient with a past medical history of gonorrhea and chlamydia presents to the ED with complaints of dysuria, this is occurred approximately for the past day, he was last sexually active 2 days ago with unprotected encounter.  He does voice  some concern for STI as he does have a previous history of gonorrhea and chlamydia and states this feels the same.  He denies any testicular swelling, testicular pain, penile discharge. GU exam was deferred per patient request.  UA showed no nitrites, leukocytes or signs of infection. Suspect dysuria originating from STI.   Patient will be prophalactically treated with Rocephin and azithro. He is well appearing, denies any rashes, testicular swelling or penile drainage. Playing on cellphone during his whole interview. Return precautions discussed, as this is patient's 10 visit for STD testing a referral to the health department was provided.    Portions of this note were generated with Scientist, clinical (histocompatibility and immunogenetics). Dictation errors may occur despite best attempts at proofreading.  Final Clinical Impression(s) / ED Diagnoses Final diagnoses:  STD exposure    Rx / DC Orders ED Discharge Orders    None       Claude Manges, PA-C 04/07/19 1323    Claude Manges, PA-C 04/07/19 1326    Terald Sleeper, MD 04/07/19 2109

## 2019-04-07 NOTE — ED Triage Notes (Signed)
Pt states that he is having pain with urination. Pt denies any new sexual partners. Pt states he just "wants to get it checked"

## 2019-04-07 NOTE — Discharge Instructions (Addendum)
You were treated for gonorrhea and chlamydia today. You will need to refrain from sexually intercourse for the next 10 days.  The number to the Health department is attached to your chart for you future STD testing.

## 2020-05-01 ENCOUNTER — Emergency Department (HOSPITAL_COMMUNITY)
Admission: EM | Admit: 2020-05-01 | Discharge: 2020-05-01 | Disposition: A | Discharge status: Home / Self Care | Payer: Self-pay | Attending: Emergency Medicine | Admitting: Emergency Medicine

## 2020-05-01 ENCOUNTER — Other Ambulatory Visit: Payer: Self-pay

## 2020-05-01 DIAGNOSIS — L03011 Cellulitis of right finger: Secondary | ICD-10-CM | POA: Insufficient documentation

## 2020-05-01 DIAGNOSIS — F17210 Nicotine dependence, cigarettes, uncomplicated: Secondary | ICD-10-CM | POA: Insufficient documentation

## 2020-05-01 MED ORDER — LIDOCAINE HCL (PF) 1 % IJ SOLN
5.0000 mL | Freq: Once | INTRAMUSCULAR | Status: AC
  Administered 2020-05-01: 5 mL
  Filled 2020-05-01: qty 30

## 2020-05-01 MED ORDER — AMOXICILLIN-POT CLAVULANATE 875-125 MG PO TABS: 1.0000 | ORAL_TABLET | Freq: Two times a day (BID) | ORAL | 0 refills | Status: DC

## 2020-05-01 NOTE — ED Provider Notes (Signed)
[facility]-EMERGENCY DEPT Provider Note   CSN: [id number] Arrival date & time: 05/01/20  1325     History Chief Complaint  Patient presents with  . Hand Pain    Larry Alvarez is a 35 y.o. male with no pertinent medical history that presents the emergency department today for finger swelling. Patient states that he started having middle finger swelling at the base of his nail that started yesterday. Swelling is only in nail.  States that its been worsening. States that the pain is severe. Denies any streaking.No joint pain or swelling.  States that she is never had anything like this before. Denies any recent antibiotics. Denies any fevers, chills, nausea or vomiting. Denies any bug bites that he is aware of. Denies any numbness or tingling down into his finger or hand. No other complaints at this time. Area has not been draining.  HPI     Past Medical History:  Diagnosis Date  . Bronchitis   . Chlamydia   . Gonorrhea     There are no problems to display for this patient.   No past surgical history on file.     Family History  Problem Relation Age of Onset  . Cancer Mother     Social History   Tobacco Use  . Smoking status: Current Every Day Smoker    Packs/day: 0.50    Types: Cigarettes  . Smokeless tobacco: Never Used  Substance Use Topics  . Alcohol use: Yes    Alcohol/week: 2.0 standard drinks    Types: 2 Cans of beer per week  . Drug use: Yes    Types: Marijuana    Home Medications Prior to Admission medications   Medication Sig Start Date End Date Taking? Authorizing Provider  amoxicillin-clavulanate (AUGMENTIN) 875-125 MG tablet Take 1 tablet by mouth every 12 (twelve) hours. 05/01/20  Yes Farrel Gordon, PA-C  acetaminophen (TYLENOL) 500 MG tablet Take 1,000 mg by mouth every 6 (six) hours as needed for mild pain.    [provider]    Allergies    Patient has no known allergies.  Review of Systems   Review of  Systems  Constitutional: Negative for diaphoresis, fatigue and fever.  Eyes: Negative for visual disturbance.  Respiratory: Negative for shortness of breath.   Cardiovascular: Negative for chest pain.  Gastrointestinal: Negative for nausea and vomiting.  Musculoskeletal: Positive for arthralgias. Negative for back pain and myalgias.  Skin: Negative for color change, pallor, rash and wound.  Neurological: Negative for syncope, weakness, light-headedness, numbness and headaches.  Psychiatric/Behavioral: Negative for behavioral problems and confusion.    Physical Exam Updated Vital Signs BP 126/81   Pulse 68   Temp 98.8 F (37.1 C) (Oral)   Resp 16   SpO2 100%   Physical Exam Constitutional:      General: He is not in acute distress.    Appearance: Normal appearance. He is not ill-appearing, toxic-appearing or diaphoretic.  HENT:     Head: Normocephalic and atraumatic.  Eyes:     Extraocular Movements: Extraocular movements intact.     Pupils: Pupils are equal, round, and reactive to light.  Cardiovascular:     Rate and Rhythm: Normal rate and regular rhythm.     Pulses: Normal pulses.  Pulmonary:     Effort: Pulmonary effort is normal.     Breath sounds: Normal breath sounds.  Musculoskeletal:        General: Normal range of motion.  Hands:     Comments: Patient with paronychia to lateral fold of right middle finger. Pulp is not swelling. Area is not draining. Area is warm and very tender to touch. No warmth. Area is about 1 cm, does not extend to DIP joint. Normal range of motion to DIP, PIP and MCP joint. Normal strength and sensation to all fingers and hand and wrist. Cap refill less than 2 seconds on each digit. Normal radial pulse.  Skin:    General: Skin is warm and dry.     Capillary Refill: Capillary refill takes less than 2 seconds.  Neurological:     General: No focal deficit present.     Mental Status: He is alert and oriented to person, place, and time.   Psychiatric:        Mood and Affect: Mood normal.        Behavior: Behavior normal.        Thought Content: Thought content normal.     ED Results / Procedures / Treatments   Labs (all labs ordered are listed, but only abnormal results are displayed) Labs Reviewed - No data to display  EKG None  Radiology No results found.  Procedures .Marland KitchenIncision and Drainage  Date/Time: 05/01/2020 5:11 PM Performed by: Farrel Gordon, PA-C Authorized by: Farrel Gordon, PA-C   Consent:    Consent obtained:  Verbal   Consent given by:  Patient   Risks discussed:  Bleeding, incomplete drainage, pain and damage to other organs   Alternatives discussed:  No treatment Universal protocol:    Procedure explained and questions answered to patient or proxy's satisfaction: yes     Relevant documents present and verified: yes     Test results available : yes     Imaging studies available: yes     Required blood products, implants, devices, and special equipment available: yes     Site/side marked: yes     Immediately prior to procedure, a time out was called: yes     Patient identity confirmed:  Verbally with patient Location:    Type:  Abscess   Size:  1   Location:  Upper extremity   Upper extremity location:  Finger   Finger location:  R long finger Pre-procedure details:    Skin preparation:  Antiseptic wash, chlorhexidine with alcohol and povidone-iodine Anesthesia:    Anesthesia method:  Nerve block   Block needle gauge:  27 G   Block anesthetic:  Lidocaine 1% w/o epi   Block injection procedure:  Anatomic landmarks identified, introduced needle, negative aspiration for blood, incremental injection and anatomic landmarks palpated   Block outcome:  Anesthesia achieved Procedure type:    Complexity:  Complex Procedure details:    Incision types:  Single straight   Incision depth:  Subcutaneous   Scalpel blade:  11   Wound management:  Probed and deloculated, irrigated with saline  and extensive cleaning   Drainage:  Bloody   Drainage amount:  Scant   Wound treatment:  Wound left open Post-procedure details:    Procedure completion:  Tolerated well, no immediate complications   (including critical care time)  Medications Ordered in ED Medications  lidocaine (PF) (XYLOCAINE) 1 % injection 5 mL (5 mLs Infiltration Given by Other 05/01/20 1621)    ED Course  I have reviewed the triage vital signs and the nursing notes.  Pertinent labs & imaging results that were available during my care of the patient were reviewed by me and considered in my  medical decision making (see chart for details).    MDM Rules/Calculators/A&P                          Larry Alvarez is a 35 y.o. male with no pertinent medical history that presents the emerge department today for finger swelling. Patient has paronychia to right middle finger. Afebrile, nontachycardic, patient appears very well. Will attempt to drain this today and place on antibiotics and have patient follow-up with PCP. No concern for felon. Patient is distally neurovascular intact.  Did attempt to try to drain this, did make small incision after digital blocking patient, patient tolerated this well. Small amount of bloody drainage came out. No pus, however I think this incision will help patient's symptoms, Dr. Estell Harpin did agree with this plan. Patient placed on antibiotics, will follow up with PCP or come back here for wound check. Patient agreeable. Symptomatic treatment discussed. Patient be discharged at this time.  Doubt need for further emergent work up at this time. I explained the diagnosis and have given explicit precautions to return to the ER including for any other new or worsening symptoms. The patient understands and accepts the medical plan as it's been dictated and I have answered their questions. Discharge instructions concerning home care and prescriptions have been given. The patient is STABLE and is  discharged to home in good condition.  I discussed this case with my attending physician who cosigned this note including patient's presenting symptoms, physical exam, and planned diagnostics and interventions. Attending physician stated agreement with plan or made changes to plan which were implemented.   Attending physician assessed patient at bedside.   Final Clinical Impression(s) / ED Diagnoses Final diagnoses:  Paronychia of finger, right    Rx / DC Orders ED Discharge Orders         Ordered    amoxicillin-clavulanate (AUGMENTIN) 875-125 MG tablet  Every 12 hours        05/01/20 1710           Farrel Gordon, PA-C 05/01/20 1805    Bethann Berkshire, MD 05/02/20 2254

## 2020-05-01 NOTE — ED Triage Notes (Signed)
Patient has pain and swelling to the right middle finger at the base of the nail bed

## 2020-05-01 NOTE — Discharge Instructions (Signed)
You are seen today for a paronychia, infection of your finger.  I want you to take the antibiotics as directed, we did try to incise it today, not much pus came out.  Wanted to follow-up with your primary care provider in the next couple of days, if you do not have 1 you can go to an urgent care or come back here for check.  He can also schedule appointment with [facility].  For the pain continue to take Tylenol as directed on the bottle.  If you have any new or worsening concerning symptoms like back to the emergency department.  Please speak to your pharmacist about any medications prescribed today in regards to side effects or interactions.  Contact a doctor if: You feel worse. You do not get better. You have more fluid, blood, or pus coming from the affected area. Your finger or knuckle is swollen or is hard to move. Get help right away if you have: A fever or chills. Redness spreading from the affected area. Pain in a joint or muscle.

## 2020-05-05 ENCOUNTER — Emergency Department (HOSPITAL_COMMUNITY)
Admission: EM | Admit: 2020-05-05 | Discharge: 2020-05-05 | Disposition: A | Discharge status: Home / Self Care | Payer: Self-pay | Attending: Emergency Medicine | Admitting: Emergency Medicine

## 2020-05-05 ENCOUNTER — Other Ambulatory Visit: Payer: Self-pay

## 2020-05-05 ENCOUNTER — Encounter (HOSPITAL_COMMUNITY): Payer: Self-pay

## 2020-05-05 DIAGNOSIS — F17210 Nicotine dependence, cigarettes, uncomplicated: Secondary | ICD-10-CM | POA: Insufficient documentation

## 2020-05-05 DIAGNOSIS — L03011 Cellulitis of right finger: Secondary | ICD-10-CM | POA: Insufficient documentation

## 2020-05-05 MED ORDER — IBUPROFEN 800 MG PO TABS
800.0000 mg | ORAL_TABLET | Freq: Once | ORAL | Status: AC
  Administered 2020-05-05: 800 mg via ORAL
  Filled 2020-05-05: qty 1

## 2020-05-05 MED ORDER — SULFAMETHOXAZOLE-TRIMETHOPRIM 800-160 MG PO TABS: 1.0000 | ORAL_TABLET | Freq: Two times a day (BID) | ORAL | 0 refills | Status: AC

## 2020-05-05 MED ORDER — IBUPROFEN 800 MG PO TABS: 800.0000 mg | ORAL_TABLET | Freq: Three times a day (TID) | ORAL | 0 refills | Status: AC

## 2020-05-05 NOTE — ED Provider Notes (Signed)
[facility]-EMERGENC[facility] DEPT Provider Note   CSN: [id number] Arrival date & time: 05/05/20  0731     History Chief Complaint  Patient presents with  . Hand Pain    Larry Alvarez is a 35 y.o. male.  35 year old right-hand-dominant male presents with complaint of ongoing right third finger pain and infection.  Patient was seen in the ED 4 days ago, attempted I&D of her right third paronychia and discharged on Augmentin.  Patient states area is not improving with antibiotics, not draining.  No other complaints or concerns.        Past Medical History:  Diagnosis Date  . Bronchitis   . Chlamydia   . Gonorrhea     There are no problems to display for this patient.   History reviewed. No pertinent surgical history.     Family History  Problem Relation Age of Onset  . Cancer Mother     Social History   Tobacco Use  . Smoking status: Current Every Day Smoker    Packs/day: 0.50    Types: Cigarettes  . Smokeless tobacco: Never Used  Vaping Use  . Vaping Use: Never used  Substance Use Topics  . Alcohol use: Yes    Alcohol/week: 2.0 standard drinks    Types: 2 Cans of beer per week  . Drug use: Yes    Types: Marijuana    Home Medications Prior to Admission medications   Medication Sig Start Date End Date Taking? Authorizing Provider  ibuprofen (ADVIL) 800 MG tablet Take 1 tablet (800 mg total) by mouth 3 (three) times daily. 05/05/20  Yes Jeannie Fend, PA-C  sulfamethoxazole-trimethoprim (BACTRIM DS) 800-160 MG tablet Take 1 tablet by mouth 2 (two) times daily for 7 days. 05/05/20 05/12/20 Yes Jeannie Fend, PA-C  acetaminophen (TYLENOL) 500 MG tablet Take 1,000 mg by mouth every 6 (six) hours as needed for mild pain.    [provider]    Allergies    Patient has no known allergies.  Review of Systems   Review of Systems  Constitutional: Negative for fever.  Musculoskeletal: Positive for myalgias.  Skin: Negative for  wound.  Allergic/Immunologic: Negative for immunocompromised state.  Neurological: Negative for weakness and numbness.  Hematological: Negative for adenopathy.    Physical Exam Updated Vital Signs BP 136/79   Pulse 64   Temp 97.6 F (36.4 C) (Oral)   Resp 18   Ht 5\' 11"  (1.803 m)   Wt 77.1 kg   SpO2 98%   BMI 23.71 kg/m   Physical Exam Vitals and nursing note reviewed.  Cardiovascular:     Pulses: Normal pulses.  Musculoskeletal:        General: Swelling and tenderness present. No deformity. Normal range of motion.     Comments: Right third finger paronychia, no active drainage however appears to have purulent blister.  Range of motion of the finger without pain.  Skin:    General: Skin is warm and dry.     Capillary Refill: Capillary refill takes less than 2 seconds.     Findings: Erythema present.  Neurological:     Sensory: No sensory deficit.     Motor: No weakness.     ED Results / Procedures / Treatments   Labs (all labs ordered are listed, but only abnormal results are displayed) Labs Reviewed - No data to display  EKG None  Radiology No results found.  Procedures . Incision and Drainage  Date/Time: 05/05/2020 9:07 AM Performed by:  Jeannie Fend, PA-C Authorized by: Jeannie Fend, PA-C   Consent:    Consent obtained:  Verbal   Consent given by:  Patient   Risks discussed:  Bleeding, incomplete drainage, pain and damage to other organs   Alternatives discussed:  No treatment Universal protocol:    Procedure explained and questions answered to patient or proxy's satisfaction: yes     Relevant documents present and verified: yes     Test results available : yes     Imaging studies available: yes     Required blood products, implants, devices, and special equipment available: yes     Site/side marked: yes     Immediately prior to procedure, a time out was called: yes     Patient identity confirmed:  Verbally with patient Location:     Indications for incision and drainage: Paronychia.   Location:  Upper extremity   Upper extremity location:  Finger   Finger location:  R long finger Pre-procedure details:    Skin preparation:  Betadine and antiseptic wash Sedation:    Sedation type:  None Anesthesia:    Anesthesia method:  None (Patient declines digital block) Procedure type:    Complexity:  Simple Procedure details:    Incision types:  Single straight   Incision depth:  Subcutaneous   Scalpel blade:  11   Drainage:  Purulent   Drainage amount:  Moderate   Packing materials:  None Post-procedure details:    Procedure completion:  Tolerated well, no immediate complications     Medications Ordered in ED Medications  ibuprofen (ADVIL) tablet 800 mg (800 mg Oral Given 05/05/20 0841)    ED Course  I have reviewed the triage vital signs and the nursing notes.  Pertinent labs & imaging results that were available during my care of the patient were reviewed by me and considered in my medical decision making (see chart for details).  Clinical Course as of 05/05/20 0908  Thu May 05, 2020  [id number] 35 year old male with right third finger paronychia, successfully treated with I&D, patient declines digital block today.  Plan is to discontinue Augmentin, start on Bactrim.  Recommend warm water soaks 3 times daily.  Refer to hand for follow-up if not improving. [LM]    Clinical Course User Index [LM] Alden Hipp   MDM Rules/Calculators/A&P                          Final Clinical Impression(s) / ED Diagnoses Final diagnoses:  Paronychia of finger, right    Rx / DC Orders ED Discharge Orders         Ordered    sulfamethoxazole-trimethoprim (BACTRIM DS) 800-160 MG tablet  2 times daily        05/05/20 0839    ibuprofen (ADVIL) 800 MG tablet  3 times daily        05/05/20 0839           Jeannie Fend, PA-C 05/05/20 0908    Lorre Nick, MD 05/06/20 [phone]

## 2020-05-05 NOTE — ED Triage Notes (Signed)
Patient has pain and swelling to the right middle finger. Patient was seen on 05/01/20 for the same. Patient states the pain and swelling are worse.

## 2020-05-05 NOTE — Discharge Instructions (Addendum)
Recommend warm salt water soaks with gentle range of motion of the finger for 20 minutes 3 times daily. Discontinue the Augmentin, start Bactrim.  This prescription has been sent to your pharmacy as well as a prescription for ibuprofen for pain.  Follow-up with a hand specialist if not improving, referral given.

## 2020-12-31 ENCOUNTER — Emergency Department (HOSPITAL_COMMUNITY)
Admission: EM | Admit: 2020-12-31 | Discharge: 2020-12-31 | Disposition: A | Discharge status: Home / Self Care | Payer: 59 | Attending: Emergency Medicine | Admitting: Emergency Medicine

## 2020-12-31 ENCOUNTER — Other Ambulatory Visit: Payer: Self-pay

## 2020-12-31 ENCOUNTER — Encounter (HOSPITAL_COMMUNITY): Payer: Self-pay

## 2020-12-31 DIAGNOSIS — J45909 Unspecified asthma, uncomplicated: Secondary | ICD-10-CM | POA: Insufficient documentation

## 2020-12-31 DIAGNOSIS — F17210 Nicotine dependence, cigarettes, uncomplicated: Secondary | ICD-10-CM | POA: Insufficient documentation

## 2020-12-31 DIAGNOSIS — R369 Urethral discharge, unspecified: Secondary | ICD-10-CM | POA: Diagnosis present

## 2020-12-31 LAB — URINALYSIS, ROUTINE W REFLEX MICROSCOPIC
Bacteria, UA: NONE SEEN
Bilirubin Urine: NEGATIVE
Glucose, UA: NEGATIVE mg/dL
Ketones, ur: NEGATIVE mg/dL
Nitrite: NEGATIVE
Protein, ur: NEGATIVE mg/dL
Specific Gravity, Urine: 1.03 (ref 1.005–1.030)
pH: 8 (ref 5.0–8.0)

## 2020-12-31 MED ORDER — DOXYCYCLINE HYCLATE 100 MG PO TABS
100.0000 mg | ORAL_TABLET | Freq: Once | ORAL | Status: AC
  Administered 2020-12-31: 100 mg via ORAL
  Filled 2020-12-31: qty 1

## 2020-12-31 MED ORDER — ALBUTEROL SULFATE HFA 108 (90 BASE) MCG/ACT IN AERS
2.0000 | INHALATION_SPRAY | Freq: Once | RESPIRATORY_TRACT | Status: AC
  Administered 2020-12-31: 2 via RESPIRATORY_TRACT
  Filled 2020-12-31: qty 6.7

## 2020-12-31 MED ORDER — CEFTRIAXONE SODIUM 1 G IJ SOLR
500.0000 mg | Freq: Once | INTRAMUSCULAR | Status: AC
  Administered 2020-12-31: 500 mg via INTRAMUSCULAR
  Filled 2020-12-31: qty 10

## 2020-12-31 MED ORDER — DOXYCYCLINE HYCLATE 100 MG PO CAPS: 100.0000 mg | ORAL_CAPSULE | Freq: Two times a day (BID) | ORAL | 0 refills | Status: DC

## 2020-12-31 MED ORDER — LIDOCAINE HCL (PF) 1 % IJ SOLN
1.0000 mL | Freq: Once | INTRAMUSCULAR | Status: AC
  Administered 2020-12-31: 1 mL
  Filled 2020-12-31: qty 30

## 2020-12-31 NOTE — Discharge Instructions (Addendum)
Use albuterol inhaler as needed for wheezing, shortness of breath, chest tightness. Take antibiotics as prescribed.  Take the entire course, even if symptoms improve. Follow-up with health department as needed for further evaluation and symptoms. Your Gonorrhea and Chlamydia tests are pending.  If positive, you will need to inform your sexual partner so they can be tested and treated.  Do not have sex for the next 2 weeks as this can cause reinfection. Return to the emergency room with any new, worsening, concerning symptoms.

## 2020-12-31 NOTE — ED Provider Notes (Signed)
[facility]-EMERGENC[facility] DEPT Provider Note   CSN: [id number] Arrival date & time: 12/31/20  1222     History Chief Complaint  Patient presents with   SEXUALLY TRANSMITTED DISEASE   Wheezing    Larry Alvarez is a 35 y.o. male presenting for evaluation of wheezing and penile discharge.  Patient states in the past few days he has had intermittent chest tightness, shortness of breath, wheezing.  It comes and flares, feels like his normal asthma.  He has associated nasal congestion.  No fevers, sore throat, cough.  No sick contacts.  He is not vaccinated for COVID.  He is out of his inhaler.  He continues to smoke cigarettes. Additionally, patient states he is having penile discharge which has been present for 2 to 3 days.  He is sexually active with several partners, all male.  Does not always use condoms.  He is having associated dysuria and urinary frequency with urgency but not much urine output.  No hematuria.  No nausea, vomiting, abdominal pain.  He does have some mild left groin pain.  He has been tested for STDs before, but positive and appropriately treated.  HPI     Past Medical History:  Diagnosis Date   Bronchitis    Chlamydia    Gonorrhea     There are no problems to display for this patient.   History reviewed. No pertinent surgical history.     Family History  Problem Relation Age of Onset   Cancer Mother     Social History   Tobacco Use   Smoking status: Every Day    Packs/day: 0.35    Types: Cigarettes   Smokeless tobacco: Never  Vaping Use   Vaping Use: Never used  Substance Use Topics   Alcohol use: Yes    Alcohol/week: 2.0 standard drinks    Types: 2 Cans of beer per week   Drug use: Yes    Types: Marijuana    Home Medications Prior to Admission medications   Medication Sig Start Date End Date Taking? Authorizing Provider  doxycycline (VIBRAMYCIN) 100 MG capsule Take 1 capsule (100 mg total) by mouth 2 (two) times  daily. 12/31/20  Yes Sabrie Moritz, PA-C  acetaminophen (TYLENOL) 500 MG tablet Take 1,000 mg by mouth every 6 (six) hours as needed for mild pain.    [provider]  ibuprofen (ADVIL) 800 MG tablet Take 1 tablet (800 mg total) by mouth 3 (three) times daily. 05/05/20   Jeannie Fend, PA-C    Allergies    Patient has no known allergies.  Review of Systems   Review of Systems  HENT:  Positive for congestion.   Respiratory:  Positive for chest tightness, shortness of breath and wheezing.   Genitourinary:  Positive for difficulty urinating, dysuria and penile discharge.   Physical Exam Updated Vital Signs BP 121/76 (BP Location: Left Arm)   Pulse 62   Temp 98.4 F (36.9 C) (Oral)   Resp 16   Ht 6' (1.829 m)   Wt 77.1 kg   SpO2 97%   BMI 23.06 kg/m   Physical Exam Vitals and nursing note reviewed. Exam conducted with a chaperone present.  Constitutional:      General: He is not in acute distress.    Appearance: Normal appearance.  HENT:     Head: Normocephalic and atraumatic.     Nose: Mucosal edema present.  Eyes:     Conjunctiva/sclera: Conjunctivae normal.     Pupils:  Pupils are equal, round, and reactive to light.  Cardiovascular:     Rate and Rhythm: Normal rate and regular rhythm.     Pulses: Normal pulses.  Pulmonary:     Effort: Pulmonary effort is normal. No respiratory distress.     Breath sounds: Normal breath sounds. No wheezing.     Comments: Speaking in full sentences.  Clear lung sounds in all fields.  No wheezing Abdominal:     General: There is no distension.     Palpations: Abdomen is soft. There is no mass.     Tenderness: There is no abdominal tenderness. There is no guarding or rebound.     Comments: No ttp of the abd  Genitourinary:    Comments: Left inguinal lymphadenopathy.  Thin white discharge coming from the meatus.  No lesions on the penis or testicles.  No tenderness over the epididymis. Musculoskeletal:        General:  Normal range of motion.     Cervical back: Normal range of motion and neck supple.  Skin:    General: Skin is warm and dry.     Capillary Refill: Capillary refill takes less than 2 seconds.  Neurological:     Mental Status: He is alert and oriented to person, place, and time.  Psychiatric:        Mood and Affect: Mood and affect normal.        Speech: Speech normal.        Behavior: Behavior normal.    ED Results / Procedures / Treatments   Labs (all labs ordered are listed, but only abnormal results are displayed) Labs Reviewed  URINALYSIS, ROUTINE W REFLEX MICROSCOPIC - Abnormal; Notable for the following components:      Result Value   APPearance HAZY (*)    Hgb urine dipstick SMALL (*)    Leukocytes,Ua SMALL (*)    All other components within normal limits  GC/CHLAMYDIA PROBE AMP ([facility]) NOT AT [facility]    EKG None  Radiology No results found.  Procedures Procedures   Medications Ordered in ED Medications  albuterol (VENTOLIN HFA) 108 (90 Base) MCG/ACT inhaler 2 puff (2 puffs Inhalation Given 12/31/20 1351)  cefTRIAXone (ROCEPHIN) injection 500 mg (500 mg Intramuscular Given 12/31/20 1352)  lidocaine (PF) (XYLOCAINE) 1 % injection 1 mL (1 mL Other Given 12/31/20 1354)  doxycycline (VIBRA-TABS) tablet 100 mg (100 mg Oral Given 12/31/20 1351)    ED Course  I have reviewed the triage vital signs and the nursing notes.  Pertinent labs & imaging results that were available during my care of the patient were reviewed by me and considered in my medical decision making (see chart for details).    MDM Rules/Calculators/A&P                           Patient resenting for evaluation of asthma and penile discharge.  On exam, patient is nontoxic.  On exam is overall reassuring, no wheezing right now.  Patient does have some mild nasal congestion.  Offered COVID test, patient declined.  Will give albuterol inhaler to use as needed for wheezing.  As he is not wheezing now, do  not feel he needs steroids or further emergent treatment. GU exam consistent with STD and that patient has penile discharge.  Urine without trichomoniasis.  Gonorrhea and Chlamydia test are pending.  Patient with left groin pain, consistent with left inguinal lymphadenopathy.  Discussed symptomatic management and importance  of antibiotics.  Will treat with antibiotics due to sxs.  Discussed pending results and importance of taking antibiotics as prescribed. At this time, patient appears safe for discharge.  Return precautions given.  Patient states he understands and agrees to plan.  Final Clinical Impression(s) / ED Diagnoses Final diagnoses:  Penile discharge  Asthma, unspecified asthma severity, unspecified whether complicated, unspecified whether persistent    Rx / DC Orders ED Discharge Orders          Ordered    doxycycline (VIBRAMYCIN) 100 MG capsule  2 times daily        12/31/20 1400             Archana Eckman, PA-C 12/31/20 1503    Mancel Bale, MD 12/31/20 1901

## 2020-12-31 NOTE — ED Triage Notes (Signed)
Patient reports that he has been having a white penile discharge x 2-3 days.  Patient also reports that he has been having wheezing x 2-3 days and does not have an inhaler.

## 2021-01-02 LAB — GC/CHLAMYDIA PROBE AMP (~~LOC~~) NOT AT ARMC
Chlamydia: POSITIVE — AB
Comment: NEGATIVE
Comment: NORMAL
Neisseria Gonorrhea: POSITIVE — AB

## 2021-05-15 ENCOUNTER — Encounter (HOSPITAL_COMMUNITY): Payer: Self-pay

## 2021-05-15 ENCOUNTER — Emergency Department (HOSPITAL_COMMUNITY)
Admission: EM | Admit: 2021-05-15 | Discharge: 2021-05-15 | Disposition: A | Discharge status: Home / Self Care | Payer: Self-pay | Attending: Emergency Medicine | Admitting: Emergency Medicine

## 2021-05-15 ENCOUNTER — Other Ambulatory Visit: Payer: Self-pay

## 2021-05-15 DIAGNOSIS — R369 Urethral discharge, unspecified: Secondary | ICD-10-CM | POA: Insufficient documentation

## 2021-05-15 DIAGNOSIS — Z202 Contact with and (suspected) exposure to infections with a predominantly sexual mode of transmission: Secondary | ICD-10-CM | POA: Insufficient documentation

## 2021-05-15 MED ORDER — DOXYCYCLINE HYCLATE 100 MG PO CAPS: 100.0000 mg | ORAL_CAPSULE | Freq: Two times a day (BID) | ORAL | 0 refills | Status: AC

## 2021-05-15 MED ORDER — STERILE WATER FOR INJECTION IJ SOLN
INTRAMUSCULAR | Status: AC
  Administered 2021-05-15: 2.1 mL
  Filled 2021-05-15: qty 10

## 2021-05-15 MED ORDER — CEFTRIAXONE SODIUM 1 G IJ SOLR
500.0000 mg | Freq: Once | INTRAMUSCULAR | Status: AC
  Administered 2021-05-15: 500 mg via INTRAMUSCULAR
  Filled 2021-05-15: qty 10

## 2021-05-15 NOTE — Discharge Instructions (Addendum)
Please pick up antibiotics and take as prescribed.   Refrain from intercourse until you and your partner have both finished the entire course of antibiotics.   If you have additional partners you will need to let them know to be tested and treated as well.   Follow up with your PCP or the local health department for additional testing.

## 2021-05-15 NOTE — ED Triage Notes (Signed)
Patient states a sexual partner told him that they had chlamydia. Patient reports penile discharge that clear/white and discomfort when he urinates x 1 week.

## 2021-05-15 NOTE — ED Provider Notes (Signed)
[facility]-EMERGENCY DEPT Provider Note   CSN: [id number] Arrival date & time: 05/15/21  2694     History  Chief Complaint  Patient presents with   Exposure to STD    Larry Alvarez is a 36 y.o. male who presents to the ED today for exposure to STD.  Patient reports that he is sexually active with 1 male partner, they do not use protection.  He was advised that she tested positive for chlamydia and he would need to be tested and treated as well.  He has been experiencing penile discharge for the past week.  He states he is having some pain to his penis.  No testicular pain or swelling.  No genital sores.  No dysuria.   The history is provided by the patient and medical records.      Home Medications Prior to Admission medications   Medication Sig Start Date End Date Taking? Authorizing Provider  doxycycline (VIBRAMYCIN) 100 MG capsule Take 1 capsule (100 mg total) by mouth 2 (two) times daily for 7 days. 05/15/21 05/22/21 Yes Angelo Caroll, PA-C  acetaminophen (TYLENOL) 500 MG tablet Take 1,000 mg by mouth every 6 (six) hours as needed for mild pain.    [provider]  doxycycline (VIBRAMYCIN) 100 MG capsule Take 1 capsule (100 mg total) by mouth 2 (two) times daily. 12/31/20   Caccavale, Sophia, PA-C  ibuprofen (ADVIL) 800 MG tablet Take 1 tablet (800 mg total) by mouth 3 (three) times daily. 05/05/20   Jeannie Fend, PA-C      Allergies    Patient has no known allergies.    Review of Systems   Review of Systems  Constitutional:  Negative for chills and fever.  Gastrointestinal:  Negative for abdominal pain, nausea and vomiting.  Genitourinary:  Positive for penile discharge and penile pain. Negative for genital sores, penile swelling, scrotal swelling and testicular pain.  All other systems reviewed and are negative.  Physical Exam Updated Vital Signs BP 129/70 (BP Location: Right Arm)    Pulse 68    Temp 98.6 F (37 C) (Oral)    Resp 14     Ht 5\' 11"  (1.803 m)    Wt 77.1 kg    SpO2 96%    BMI 23.71 kg/m  Physical Exam Vitals and nursing note reviewed.  Constitutional:      Appearance: He is not ill-appearing.  HENT:     Head: Normocephalic and atraumatic.  Eyes:     Conjunctiva/sclera: Conjunctivae normal.  Cardiovascular:     Rate and Rhythm: Normal rate and regular rhythm.  Pulmonary:     Effort: Pulmonary effort is normal.     Breath sounds: Normal breath sounds.  Genitourinary:    Comments: Chaperone present for exam Circumcised penis with thin clear discharge without phimosis/paraphimosis, hypospadias, erythema, tenderness. No rashes or lesions. Testes with no masses or tenderness, no swelling, and cremasterics reflex present bilaterally. No abnormal lie. No inguinal hernias or adenopathy present. Skin:    General: Skin is warm and dry.     Coloration: Skin is not jaundiced.  Neurological:     Mental Status: He is alert.    ED Results / Procedures / Treatments   Labs (all labs ordered are listed, but only abnormal results are displayed) Labs Reviewed  GC/CHLAMYDIA PROBE AMP ([place]) NOT AT [facility]    EKG None  Radiology No results found.  Procedures Procedures    Medications Ordered in ED Medications  cefTRIAXone (ROCEPHIN) injection 500 mg (500 mg Intramuscular Given 05/15/21 1041)  sterile water (preservative free) injection (2.1 mLs  Given 05/15/21 1043)    ED Course/ Medical Decision Making/ A&P                           Medical Decision Making 36 year old male who presents to the ED today for exposure to chlamydia.  Has also been having penile discharge x1 week.  On arrival to the ED vitals were stable.  Patient appears to be in no acute distress.  Chaperone present for exam.  Has thin clear penile discharge.  No testicular swelling/tenderness palpation.  Will obtain GC chlamydia via urine sample.  We will empirically treat for both.  She declines HIV or syphilis testing at this time.  Have  instructed he needs to finish entire course of antibiotics and his partner needs to do the same prior and engaging in intercourse again.  He states he is not sexually active with any other partners.  Stable for discharge after Rocephin injection.   Risk Prescription drug management.          Final Clinical Impression(s) / ED Diagnoses Final diagnoses:  STD exposure  Penile discharge    Rx / DC Orders ED Discharge Orders          Ordered    doxycycline (VIBRAMYCIN) 100 MG capsule  2 times daily        05/15/21 1034             Discharge Instructions      Please pick up antibiotics and take as prescribed.   Refrain from intercourse until you and your partner have both finished the entire course of antibiotics.   If you have additional partners you will need to let them know to be tested and treated as well.   Follow up with your PCP or the local health department for additional testing.        Tanda Rockers, PA-C 05/15/21 1105    Horton, Clabe Seal, DO 05/15/21 1539

## 2022-07-27 ENCOUNTER — Encounter (HOSPITAL_COMMUNITY): Payer: Self-pay

## 2022-07-27 ENCOUNTER — Other Ambulatory Visit: Payer: Self-pay

## 2022-07-27 ENCOUNTER — Emergency Department (HOSPITAL_COMMUNITY)
Admission: EM | Admit: 2022-07-27 | Discharge: 2022-07-27 | Disposition: A | Discharge status: Home / Self Care | Payer: Self-pay | Attending: Emergency Medicine | Admitting: Emergency Medicine

## 2022-07-27 DIAGNOSIS — Z202 Contact with and (suspected) exposure to infections with a predominantly sexual mode of transmission: Secondary | ICD-10-CM | POA: Insufficient documentation

## 2022-07-27 MED ORDER — DOXYCYCLINE HYCLATE 100 MG PO CAPS: 100.0000 mg | ORAL_CAPSULE | Freq: Two times a day (BID) | ORAL | 0 refills | Status: DC

## 2022-07-27 MED ORDER — LIDOCAINE HCL (PF) 1 % IJ SOLN
1.0000 mL | Freq: Once | INTRAMUSCULAR | Status: AC
  Administered 2022-07-27: 2.1 mL
  Filled 2022-07-27: qty 30

## 2022-07-27 MED ORDER — CEFTRIAXONE SODIUM 1 G IJ SOLR
500.0000 mg | Freq: Once | INTRAMUSCULAR | Status: AC
  Administered 2022-07-27: 500 mg via INTRAMUSCULAR
  Filled 2022-07-27: qty 10

## 2022-07-27 NOTE — ED Provider Notes (Signed)
[place] EMERGENCY DEPARTMENT AT [facility] Provider Note   CSN: [id number] Arrival date & time: 07/27/22  1204     History  Chief Complaint  Patient presents with   Exposure to STD   Urinary Frequency    Larry Alvarez is a 37 y.o. male.  Patient presents to the emergency department with concerns of exposure to an STD.  Patient complains of mild dysuria, penile discharge.  Patient states a sexual partner tested positive for chlamydia yesterday.  Patient denies fevers, nausea, vomiting.  Past medical history significant for sexually transmitted infection, bronchitis  HPI     Home Medications Prior to Admission medications   Medication Sig Start Date End Date Taking? Authorizing Provider  doxycycline (VIBRAMYCIN) 100 MG capsule Take 1 capsule (100 mg total) by mouth 2 (two) times daily. 07/27/22  Yes Darrick Grinder, PA-C  acetaminophen (TYLENOL) 500 MG tablet Take 1,000 mg by mouth every 6 (six) hours as needed for mild pain.    [provider]  ibuprofen (ADVIL) 800 MG tablet Take 1 tablet (800 mg total) by mouth 3 (three) times daily. 05/05/20   Jeannie Fend, PA-C      Allergies    Patient has no known allergies.    Review of Systems   Review of Systems  Physical Exam Updated Vital Signs BP (!) 141/83   Pulse 60   Temp 99.5 F (37.5 C) (Oral)   Resp 18   Ht 6' (1.829 m)   Wt 79.4 kg   SpO2 100%   BMI 23.73 kg/m  Physical Exam HENT:     Head: Normocephalic and atraumatic.     Mouth/Throat:     Mouth: Mucous membranes are moist.  Eyes:     Conjunctiva/sclera: Conjunctivae normal.  Cardiovascular:     Rate and Rhythm: Normal rate.  Pulmonary:     Effort: Pulmonary effort is normal. No respiratory distress.  Genitourinary:    Comments: Deferred Musculoskeletal:        General: No signs of injury.     Cervical back: Normal range of motion.  Skin:    General: Skin is dry.  Neurological:     Mental Status: He is alert.   Psychiatric:        Speech: Speech normal.        Behavior: Behavior normal.     ED Results / Procedures / Treatments   Labs (all labs ordered are listed, but only abnormal results are displayed) Labs Reviewed  GC/CHLAMYDIA PROBE AMP () NOT AT [facility]    EKG None  Radiology No results found.  Procedures Procedures    Medications Ordered in ED Medications  cefTRIAXone (ROCEPHIN) injection 500 mg (has no administration in time range)  lidocaine (PF) (XYLOCAINE) 1 % injection 1-2.1 mL (has no administration in time range)    ED Course/ Medical Decision Making/ A&P                             Medical Decision Making Risk Prescription drug management.   Patient presents to the emergency room with concerns over possible exposure to STI.  Gonorrhea chlamydia testing pending at this time.  Patient with known partner with chlamydia.  Presumptively treating for both gonorrhea and chlamydia.  Patient denies HIV/RPR testing.  Patient administered Rocephin and prescribed outpatient course of doxycycline.  Patient may follow-up on MyChart for results of testing.  Patient advised to refrain from  sexual activity until completed with antibiotic therapy.        Final Clinical Impression(s) / ED Diagnoses Final diagnoses:  Exposure to STD    Rx / DC Orders ED Discharge Orders          Ordered    doxycycline (VIBRAMYCIN) 100 MG capsule  2 times daily        07/27/22 1243              Pamala Duffel 07/27/22 1246    Lorre Nick, MD 07/30/22 1622

## 2022-07-27 NOTE — ED Triage Notes (Signed)
Pt states he was exposed to chlamydia. Has been having urinary frequency. Denies other sx.

## 2022-07-27 NOTE — ED Notes (Signed)
Lab called regarding urine sample not being in process. Lab states they will process the urine test

## 2022-07-27 NOTE — Discharge Instructions (Signed)
You were seen today due to exposure to an STD. Please complete the entire course of antibiotics.  Refrain from sexual activity until finished.

## 2022-07-30 LAB — GC/CHLAMYDIA PROBE AMP (~~LOC~~) NOT AT ARMC
Chlamydia: NEGATIVE
Comment: NEGATIVE
Comment: NORMAL
Neisseria Gonorrhea: NEGATIVE

## 2022-12-14 ENCOUNTER — Other Ambulatory Visit: Payer: Self-pay

## 2022-12-14 ENCOUNTER — Encounter (HOSPITAL_COMMUNITY): Payer: Self-pay | Admitting: Emergency Medicine

## 2022-12-14 ENCOUNTER — Emergency Department (HOSPITAL_COMMUNITY)
Admission: EM | Admit: 2022-12-14 | Discharge: 2022-12-14 | Disposition: A | Discharge status: Home / Self Care | Payer: Self-pay | Attending: Emergency Medicine | Admitting: Emergency Medicine

## 2022-12-14 DIAGNOSIS — R300 Dysuria: Secondary | ICD-10-CM

## 2022-12-14 DIAGNOSIS — N4889 Other specified disorders of penis: Secondary | ICD-10-CM | POA: Insufficient documentation

## 2022-12-14 LAB — URINALYSIS, ROUTINE W REFLEX MICROSCOPIC
Bacteria, UA: NONE SEEN
Bilirubin Urine: NEGATIVE
Glucose, UA: NEGATIVE mg/dL
Ketones, ur: NEGATIVE mg/dL
Leukocytes,Ua: NEGATIVE
Nitrite: NEGATIVE
Protein, ur: NEGATIVE mg/dL
Specific Gravity, Urine: 1.024 (ref 1.005–1.030)
pH: 5 (ref 5.0–8.0)

## 2022-12-14 MED ORDER — CEFTRIAXONE SODIUM 1 G IJ SOLR
500.0000 mg | Freq: Once | INTRAMUSCULAR | Status: AC
  Administered 2022-12-14: 500 mg via INTRAMUSCULAR
  Filled 2022-12-14: qty 10

## 2022-12-14 MED ORDER — DOXYCYCLINE HYCLATE 100 MG PO CAPS: 100.0000 mg | ORAL_CAPSULE | Freq: Two times a day (BID) | ORAL | 0 refills | Status: DC

## 2022-12-14 NOTE — Discharge Instructions (Signed)
You are being treated empirically for gonorrhea and chlamydia.  I recommend you abstain from sexual intercourse until you finish the entire course of antibiotics and are asymptomatic.  He should follow-up with a primary care provider.

## 2022-12-14 NOTE — ED Triage Notes (Signed)
PT reports he feels like he has an infection in his penis. Denies any discharge or dysuria.

## 2022-12-14 NOTE — ED Provider Notes (Signed)
[facility] Provider Note   CSN: [id number] Arrival date & time: 12/14/22  0737     History  Chief Complaint  Patient presents with   Dysuria    Larry Alvarez is a 37 y.o. male.   Dysuria Presenting symptoms: dysuria   37 year old male presenting for penile pain.  He reports some pain when he urinates as well as penile pain.  He is sexually active with his girlfriend.  He is concerned for possible STD as this feels similar to prior.  Although his girlfriend was recently tested for STD was negative.  Not been sexually active since.  No abdominal pain or fevers.  No penile discharge.  Not had any scrotal or testicular pain.  No recent antibiotics.     Home Medications Prior to Admission medications   Medication Sig Start Date End Date Taking? Authorizing Provider  acetaminophen (TYLENOL) 500 MG tablet Take 1,000 mg by mouth every 6 (six) hours as needed for mild pain.    [provider]  doxycycline (VIBRAMYCIN) 100 MG capsule Take 1 capsule (100 mg total) by mouth 2 (two) times daily. 12/14/22   Laurence Spates, MD  ibuprofen (ADVIL) 800 MG tablet Take 1 tablet (800 mg total) by mouth 3 (three) times daily. 05/05/20   Jeannie Fend, PA-C      Allergies    Patient has no known allergies.    Review of Systems   Review of Systems  Genitourinary:  Positive for dysuria.  Review of systems completed and notable as per HPI.  ROS otherwise negative.   Physical Exam Updated Vital Signs BP 138/80 (BP Location: Right Arm)   Pulse 62   Temp 98 F (36.7 C) (Oral)   Resp 18   SpO2 99%  Physical Exam Vitals and nursing note reviewed. Exam conducted with a chaperone present.  Constitutional:      General: He is not in acute distress.    Appearance: He is well-developed.  HENT:     Head: Normocephalic and atraumatic.  Eyes:     Conjunctiva/sclera: Conjunctivae normal.  Cardiovascular:     Rate and Rhythm: Normal rate  and regular rhythm.     Heart sounds: No murmur heard. Pulmonary:     Effort: Pulmonary effort is normal. No respiratory distress.     Breath sounds: Normal breath sounds.  Abdominal:     Palpations: Abdomen is soft.     Tenderness: There is no abdominal tenderness.  Genitourinary:    Penis: Normal.      Testes: Normal.  Musculoskeletal:        General: No swelling.     Cervical back: Neck supple.  Skin:    General: Skin is warm and dry.     Capillary Refill: Capillary refill takes less than 2 seconds.  Neurological:     Mental Status: He is alert.  Psychiatric:        Mood and Affect: Mood normal.     ED Results / Procedures / Treatments   Labs (all labs ordered are listed, but only abnormal results are displayed) Labs Reviewed  URINALYSIS, ROUTINE W REFLEX MICROSCOPIC - Abnormal; Notable for the following components:      Result Value   Hgb urine dipstick MODERATE (*)    All other components within normal limits  GC/CHLAMYDIA PROBE AMP ([place]) NOT AT [facility]    EKG None  Radiology No results found.  Procedures Procedures    Medications  Ordered in ED Medications  cefTRIAXone (ROCEPHIN) injection 500 mg (500 mg Intramuscular Given 12/14/22 0947)    ED Course/ Medical Decision Making/ A&P                                 Medical Decision Making Amount and/or Complexity of Data Reviewed Labs: ordered.  Risk Prescription drug management.   Medical Decision Making:   Larry Alvarez is a 37 y.o. male who presented to the ED today with dysuria and concern for STD.  Vital signs reviewed.  Exam is well-appearing.  Normal exam externally, benign abdominal exam.  I am concerned for possible STD.  Patient is interested in gonorrhea chlamydia testing empiric treatment.  Will treat with Rocephin and doxycycline.  I did offer and recommend HIV and syphilis testing but patient declined.  Will obtain urinalysis as well although lower suspicion for urinary tract  infection.  Urinalysis with some white blood cells likely related to urethritis.  He has received Rocephin, given prescription for doxycycline.  Discussed avoiding sexual activity until he is asymptomatic and finishes treatment.  Recommend partner testing.  Discharged with return precautions.   Patient placed on continuous vitals and telemetry monitoring while in ED which was reviewed periodically.  Reviewed and confirmed nursing documentation for past medical history, family history, social history.   Patient's presentation is most consistent with acute complicated illness / injury requiring diagnostic workup.           Final Clinical Impression(s) / ED Diagnoses Final diagnoses:  Dysuria    Rx / DC Orders ED Discharge Orders          Ordered    doxycycline (VIBRAMYCIN) 100 MG capsule  2 times daily        12/14/22 1025              Laurence Spates, MD 12/14/22 1814

## 2022-12-17 LAB — GC/CHLAMYDIA PROBE AMP (~~LOC~~) NOT AT ARMC
Chlamydia: NEGATIVE
Comment: NEGATIVE
Comment: NORMAL
Neisseria Gonorrhea: NEGATIVE

## 2023-03-15 ENCOUNTER — Encounter (HOSPITAL_COMMUNITY): Payer: Self-pay

## 2023-03-15 ENCOUNTER — Emergency Department (HOSPITAL_COMMUNITY)
Admission: EM | Admit: 2023-03-15 | Discharge: 2023-03-15 | Disposition: A | Discharge status: Home / Self Care | Payer: Self-pay | Attending: Emergency Medicine | Admitting: Emergency Medicine

## 2023-03-15 ENCOUNTER — Other Ambulatory Visit: Payer: Self-pay

## 2023-03-15 ENCOUNTER — Emergency Department (HOSPITAL_COMMUNITY): Payer: Self-pay

## 2023-03-15 DIAGNOSIS — N50812 Left testicular pain: Secondary | ICD-10-CM | POA: Insufficient documentation

## 2023-03-15 LAB — URINALYSIS, ROUTINE W REFLEX MICROSCOPIC
Bacteria, UA: NONE SEEN
Bilirubin Urine: NEGATIVE
Glucose, UA: NEGATIVE mg/dL
Ketones, ur: NEGATIVE mg/dL
Leukocytes,Ua: NEGATIVE
Nitrite: NEGATIVE
Protein, ur: NEGATIVE mg/dL
Specific Gravity, Urine: 1.03 (ref 1.005–1.030)
pH: 5 (ref 5.0–8.0)

## 2023-03-15 MED ORDER — CEFTRIAXONE SODIUM 1 G IJ SOLR
500.0000 mg | Freq: Once | INTRAMUSCULAR | Status: AC
  Administered 2023-03-15: 500 mg via INTRAMUSCULAR
  Filled 2023-03-15: qty 10

## 2023-03-15 MED ORDER — STERILE WATER FOR INJECTION IJ SOLN
INTRAMUSCULAR | Status: AC
  Filled 2023-03-15: qty 10

## 2023-03-15 MED ORDER — DOXYCYCLINE HYCLATE 100 MG PO CAPS: 100.0000 mg | ORAL_CAPSULE | Freq: Two times a day (BID) | ORAL | 0 refills | Status: DC

## 2023-03-15 NOTE — ED Triage Notes (Signed)
Patient reports left sided testicle pain x 2 days. Thinks he was exposed to an STD. Denies rash, burning with urination, and discharge.

## 2023-03-15 NOTE — ED Provider Triage Note (Signed)
Emergency Medicine Provider Triage Evaluation Note  Larry Alvarez , a 37 y.o. male  was evaluated in triage.  Pt complains of left sided testicular pain. He reports that he may have been exposed to STD but is not sure. He denies penile discharge, fever  Review of Systems  Positive: L testicular pain Negative: Penile discharge  Physical Exam  BP 136/87 (BP Location: Left Arm)   Pulse 65   Temp 98.2 F (36.8 C) (Oral)   Resp 18   Ht 5\' 11"  (1.803 m)   Wt 72.6 kg   SpO2 100%   BMI 22.32 kg/m  Gen:   Awake, no distress   Resp:  Normal effort  MSK:   Moves extremities without difficulty  Other:    Medical Decision Making  Medically screening exam initiated at 1:08 PM.  Appropriate orders placed.  Larry Alvarez was informed that the remainder of the evaluation will be completed by another provider, this initial triage assessment does not replace that evaluation, and the importance of remaining in the ED until their evaluation is complete.  Labs and [place] ordered   Larry Alvarez, [place] 03/15/23 1316

## 2023-03-15 NOTE — Discharge Instructions (Addendum)
It was a pleasure taking care of you this afternoon.  You were evaluated in the emergency room for left testicle pain.  UA was performed which did not show a UTI.  As discussed your STI testing is still pending.  An ultrasound was additionally performed of your scrotum which was normal.  Doxycycline, an antibiotic has been sent into your pharmacy.  Please be sure to complete the full 7-day course.  If you experience any new or worsening symptoms including worsening testicle pain with swelling, nausea and vomiting please return to the emergency room.

## 2023-03-15 NOTE — ED Provider Notes (Signed)
[place] EMERGENCY DEPARTMENT AT [facility] Provider Note   CSN: [id number] Arrival date & time: 03/15/23  1252     History  Chief Complaint  Patient presents with   Testicle Pain    Larry Alvarez is a 37 y.o. male who presents with left testicle pain since yesterday.  He states he had a new sex partner 2 days ago.  He denies any penile discharge.  Pain is not constant only occurs when moving.  Denies any abdominal pain, nausea or vomiting.  No fevers or chills.   Testicle Pain       Home Medications Prior to Admission medications   Medication Sig Start Date End Date Taking? Authorizing Provider  acetaminophen (TYLENOL) 500 MG tablet Take 1,000 mg by mouth every 6 (six) hours as needed for mild pain.    [provider]  doxycycline (VIBRAMYCIN) 100 MG capsule Take 1 capsule (100 mg total) by mouth 2 (two) times daily. 03/15/23   Halford Decamp, PA-C  ibuprofen (ADVIL) 800 MG tablet Take 1 tablet (800 mg total) by mouth 3 (three) times daily. 05/05/20   Jeannie Fend, PA-C      Allergies    Patient has no known allergies.    Review of Systems   Review of Systems  Genitourinary:  Positive for testicular pain.    Physical Exam Updated Vital Signs BP (!) 113/101   Pulse 70   Temp 98.3 F (36.8 C) (Oral)   Resp 18   Ht 5\' 11"  (1.803 m)   Wt 72.6 kg   SpO2 99%   BMI 22.32 kg/m  Physical Exam Vitals and nursing note reviewed. Exam conducted with a chaperone present.  Constitutional:      General: He is not in acute distress.    Appearance: He is well-developed.  HENT:     Head: Normocephalic and atraumatic.  Eyes:     Conjunctiva/sclera: Conjunctivae normal.  Cardiovascular:     Rate and Rhythm: Normal rate and regular rhythm.     Heart sounds: No murmur heard. Pulmonary:     Effort: Pulmonary effort is normal. No respiratory distress.     Breath sounds: Normal breath sounds.  Abdominal:     Palpations: Abdomen is soft.      Tenderness: There is no abdominal tenderness.  Genitourinary:    Penis: Normal.      Comments: Some left sided epididymal tenderness.  No significant scrotal erythema, warmth or swelling Musculoskeletal:        General: No swelling.     Cervical back: Neck supple.  Skin:    General: Skin is warm and dry.     Capillary Refill: Capillary refill takes less than 2 seconds.  Neurological:     Mental Status: He is alert.  Psychiatric:        Mood and Affect: Mood normal.     ED Results / Procedures / Treatments   Labs (all labs ordered are listed, but only abnormal results are displayed) Labs Reviewed  URINALYSIS, ROUTINE W REFLEX MICROSCOPIC - Abnormal; Notable for the following components:      Result Value   Hgb urine dipstick SMALL (*)    All other components within normal limits  GC/CHLAMYDIA PROBE AMP ([place]) NOT AT [facility]    EKG None  Radiology US SCROTUM W/DOPPLER  Result Date: 03/15/2023 CLINICAL DATA:  Pain. EXAM: SCROTAL ULTRASOUND DOPPLER ULTRASOUND OF THE TESTICLES TECHNIQUE: Complete ultrasound examination of the testicles, epididymis, and other  scrotal structures was performed. Color and spectral Doppler ultrasound were also utilized to evaluate blood flow to the testicles. COMPARISON:  None Available. FINDINGS: Right testicle Measurements: 2.2 x 2.8 x 5.2 cm. No mass or microlithiasis visualized. There is a 2 x 2 mm scrotolith, which is a benign finding and of no clinical significance. Left testicle Measurements: 2.2 x 3.1 x 5.1 cm. No mass or microlithiasis visualized. Right epididymis:  Normal in size and appearance. Left epididymis:  Normal in size and appearance. Hydrocele:  None visualized. Varicocele:  None visualized. Pulsed Doppler interrogation of both testes demonstrates normal low resistance arterial and venous waveforms bilaterally. IMPRESSION: *Essentially unremarkable exam. Electronically Signed   By: Jules Schick M.D.   On: 03/15/2023 15:20     Procedures Procedures    Medications Ordered in ED Medications  cefTRIAXone (ROCEPHIN) injection 500 mg (has no administration in time range)    ED Course/ Medical Decision Making/ A&P                                 Medical Decision Making  This patient presents to the ED with chief complaint(s) of testicle pain.  The complaint involves an extensive differential diagnosis and also carries with it a high risk of complications and morbidity.   pertinent past medical history as listed in HPI  The differential diagnosis includes  STI, epididymitis, orchitis, torsion, hernia The initial plan is to  Will obtain urine, GC, scrotal ultrasound Additional history obtained: No additional historians Records reviewed previous admission documents  Initial Assessment:   Patient is overall well-appearing.  Independent ECG interpretation:  none  Independent labs interpretation:  The following labs were independently interpreted:  UA without evidence of UTI, GC pending  Independent visualization and interpretation of imaging: I independently visualized the following imaging with scope of interpretation limited to determining acute life threatening conditions related to emergency care: Ultrasound, which was unremarkable  Treatment and Reassessment: Patient given ceftriaxone empirically during visit.  Consultations obtained:   None  Disposition:   Patient discharged home.  Given ceftriaxone and Doxy empirically for gonorrhea chlamydia.  The patient has been appropriately medically screened and/or stabilized in the ED. I have low suspicion for any other emergent medical condition which would require further screening, evaluation or treatment in the ED or require inpatient management. At time of discharge the patient is hemodynamically stable and in no acute distress. I have discussed work-up results and diagnosis with patient and answered all questions. Patient is agreeable with  discharge plan. We discussed strict return precautions for returning to the emergency department and they verbalized understanding.     Social Determinants of Health:   none  This note was dictated with voice recognition software.  Despite best efforts at proofreading, errors may have occurred which can change the documentation meaning.         Final Clinical Impression(s) / ED Diagnoses Final diagnoses:  Left testicular pain    Rx / DC Orders ED Discharge Orders          Ordered    doxycycline (VIBRAMYCIN) 100 MG capsule  2 times daily        03/15/23 1538              Fabienne Bruns 03/15/23 1549    Derwood Kaplan, MD 03/17/23 [phone]

## 2023-03-18 LAB — GC/CHLAMYDIA PROBE AMP (~~LOC~~) NOT AT ARMC
Chlamydia: NEGATIVE
Comment: NEGATIVE
Comment: NORMAL
Neisseria Gonorrhea: NEGATIVE

## 2023-04-22 ENCOUNTER — Emergency Department (HOSPITAL_COMMUNITY)
Admission: EM | Admit: 2023-04-22 | Discharge: 2023-04-22 | Disposition: A | Discharge status: Home / Self Care | Payer: Self-pay | Attending: Emergency Medicine | Admitting: Emergency Medicine

## 2023-04-22 ENCOUNTER — Encounter (HOSPITAL_COMMUNITY): Payer: Self-pay

## 2023-04-22 ENCOUNTER — Other Ambulatory Visit: Payer: Self-pay

## 2023-04-22 DIAGNOSIS — Z711 Person with feared health complaint in whom no diagnosis is made: Secondary | ICD-10-CM

## 2023-04-22 DIAGNOSIS — R369 Urethral discharge, unspecified: Secondary | ICD-10-CM | POA: Insufficient documentation

## 2023-04-22 DIAGNOSIS — R300 Dysuria: Secondary | ICD-10-CM | POA: Insufficient documentation

## 2023-04-22 DIAGNOSIS — Z202 Contact with and (suspected) exposure to infections with a predominantly sexual mode of transmission: Secondary | ICD-10-CM | POA: Insufficient documentation

## 2023-04-22 LAB — URINALYSIS, ROUTINE W REFLEX MICROSCOPIC
Bacteria, UA: NONE SEEN
Bilirubin Urine: NEGATIVE
Glucose, UA: NEGATIVE mg/dL
Ketones, ur: NEGATIVE mg/dL
Leukocytes,Ua: NEGATIVE
Nitrite: NEGATIVE
Protein, ur: NEGATIVE mg/dL
Specific Gravity, Urine: 1.019 (ref 1.005–1.030)
pH: 5 (ref 5.0–8.0)

## 2023-04-22 MED ORDER — AZITHROMYCIN 250 MG PO TABS
1000.0000 mg | ORAL_TABLET | Freq: Once | ORAL | Status: AC
  Administered 2023-04-22: 1000 mg via ORAL
  Filled 2023-04-22: qty 4

## 2023-04-22 MED ORDER — CEFTRIAXONE SODIUM 1 G IJ SOLR
500.0000 mg | Freq: Once | INTRAMUSCULAR | Status: AC
  Administered 2023-04-22: 500 mg via INTRAMUSCULAR
  Filled 2023-04-22: qty 10

## 2023-04-22 NOTE — ED Provider Triage Note (Addendum)
 Emergency Medicine Provider Triage Evaluation Note  Larry Alvarez , a 38 y.o. male  was evaluated in triage.  Pt complains of 2 day hx of dysuria, clear/cloudy penile discharge. Had unprotected intercourse 3 days ago, began experiencing Sx day after.   Review of Systems  Positive: N/a Negative: Fevers, headaches, chest pain, shortness of breath, back pain, abdominal pain, n/v/d, testicular pain, rashes, lesions, urgency, incontinence.  Physical Exam  BP 110/75   Pulse 62   Temp 98 F (36.7 C)   Resp 20   Ht 5' 11 (1.803 m)   Wt 72.6 kg   SpO2 100%   BMI 22.32 kg/m  Gen:   Awake, no distress   Resp:  Normal effort  MSK:   Moves extremities without difficulty  Other:    Medical Decision Making  Medically screening exam initiated at 12:49 PM.  Appropriate orders placed.  [name] was informed that the remainder of the evaluation will be completed by another provider, this initial triage assessment does not replace that evaluation, and the importance of remaining in the ED until their evaluation is complete.     [name], PA-C 04/22/23 1253    [name], [place] 04/22/23 1253

## 2023-04-22 NOTE — ED Triage Notes (Signed)
 C/o dysuria, clear/cloudy penile discharge after having unprotected sex 3 days ago.

## 2023-04-22 NOTE — Discharge Instructions (Signed)
 Today you were seen for STD symptoms.  You have 1 or more labs pending and will be notified of these results in a few days. Thank you for letting us  treat you today. After performing a physical exam, I feel you are safe to go home. Please follow up with your PCP in the next several days and provide them with your records from this visit. Return to the Emergency Room if pain becomes severe or symptoms worsen.

## 2023-04-22 NOTE — ED Provider Notes (Signed)
 [place] EMERGENCY DEPARTMENT AT [facility] Provider Note   CSN: [id number] Arrival date & time: 04/22/23  1232     History  Chief Complaint  Patient presents with   Exposure to STD    Larry Alvarez is a 38 y.o. male past medical history significant for chlamydia and gonorrhea presents today with penile discharge and dysuria after unprotected sex 3 days ago.  Patient is unsure if his partner has any STD symptoms as well.  Patient denies fever, chills, flank pain, nausea, vomiting, diarrhea, testicular pain, or abdominal pain.   Exposure to STD       Home Medications Prior to Admission medications   Medication Sig Start Date End Date Taking? Authorizing Provider  acetaminophen  (TYLENOL ) 500 MG tablet Take 1,000 mg by mouth every 6 (six) hours as needed for mild pain.    [provider]  doxycycline  (VIBRAMYCIN ) 100 MG capsule Take 1 capsule (100 mg total) by mouth 2 (two) times daily. 03/15/23   Donnajean Lynwood DEL, PA-C  ibuprofen  (ADVIL ) 800 MG tablet Take 1 tablet (800 mg total) by mouth 3 (three) times daily. 05/05/20   Beverley Leita LABOR, PA-C      Allergies    Patient has no known allergies.    Review of Systems   Review of Systems  Genitourinary:  Positive for dysuria and penile discharge.    Physical Exam Updated Vital Signs BP 110/75   Pulse 62   Temp 98 F (36.7 C)   Resp 20   Ht 5' 11 (1.803 m)   Wt 72.6 kg   SpO2 100%   BMI 22.32 kg/m  Physical Exam Vitals and nursing note reviewed.  Constitutional:      General: He is not in acute distress.    Appearance: Normal appearance. He is well-developed.  HENT:     Head: Normocephalic and atraumatic.     Right Ear: External ear normal.     Left Ear: External ear normal.  Eyes:     Extraocular Movements: Extraocular movements intact.     Conjunctiva/sclera: Conjunctivae normal.  Cardiovascular:     Rate and Rhythm: Normal rate and regular rhythm.     Pulses: Normal pulses.      Heart sounds: Normal heart sounds. No murmur heard. Pulmonary:     Effort: Pulmonary effort is normal. No respiratory distress.     Breath sounds: Normal breath sounds.  Abdominal:     Palpations: Abdomen is soft.     Tenderness: There is no abdominal tenderness.  Musculoskeletal:        General: No swelling.     Cervical back: Neck supple.  Skin:    General: Skin is warm and dry.     Capillary Refill: Capillary refill takes less than 2 seconds.  Neurological:     General: No focal deficit present.     Mental Status: He is alert.     Motor: No weakness.  Psychiatric:        Mood and Affect: Mood normal.     ED Results / Procedures / Treatments   Labs (all labs ordered are listed, but only abnormal results are displayed) Labs Reviewed  URINALYSIS, ROUTINE W REFLEX MICROSCOPIC - Abnormal; Notable for the following components:      Result Value   Hgb urine dipstick SMALL (*)    All other components within normal limits  GC/CHLAMYDIA PROBE AMP ([place]) NOT AT [facility]    EKG None  Radiology No results  found.  Procedures Procedures    Medications Ordered in ED Medications  cefTRIAXone  (ROCEPHIN ) injection 500 mg (has no administration in time range)  azithromycin  (ZITHROMAX ) tablet 1,000 mg (has no administration in time range)    ED Course/ Medical Decision Making/ A&P                                 Medical Decision Making  This patient presents to the ED with chief complaint(s) of penile discharge and dysuria with pertinent past medical history of gonorrhea and chlamydia which further complicates the presenting complaint. The complaint involves an extensive differential diagnosis and also carries with it a high risk of complications and morbidity.    The differential diagnosis includes gonorrhea, chlamydia, UTI  ED Course and Reassessment: Patient given prophylactic azithromycin  p.o. and Rocephin  IM  Independent labs interpretation:  The following labs  were independently interpreted:  UA: Small hemoglobin GC chlamydia: Pending  Consultation: - Consulted or discussed management/test interpretation w/ external professional: None  Consideration for admission or further workup: Consider for mission further workup however patient's vital signs and physical exam of been reassuring.  Given the patient is symptomatic you will be given prophylactic treatment with 1 g of azithromycin  and 500 mg Rocephin  IM.  Patient should follow-up with PCP if symptoms persist for further evaluation and treatment.        Final Clinical Impression(s) / ED Diagnoses Final diagnoses:  Concern about STD in male without diagnosis    Rx / DC Orders ED Discharge Orders     None         Francis Ileana SAILOR, PA-C 04/22/23 1453    Kingsley, Victoria K, DO 04/22/23 1520

## 2023-04-23 LAB — GC/CHLAMYDIA PROBE AMP (~~LOC~~) NOT AT ARMC
Chlamydia: NEGATIVE
Comment: NEGATIVE
Comment: NORMAL
Neisseria Gonorrhea: NEGATIVE

## 2023-05-04 ENCOUNTER — Emergency Department (HOSPITAL_COMMUNITY)
Admission: EM | Admit: 2023-05-04 | Discharge: 2023-05-04 | Disposition: A | Discharge status: Home / Self Care | Payer: Self-pay | Attending: Emergency Medicine | Admitting: Emergency Medicine

## 2023-05-04 DIAGNOSIS — R369 Urethral discharge, unspecified: Secondary | ICD-10-CM | POA: Insufficient documentation

## 2023-05-04 DIAGNOSIS — Z711 Person with feared health complaint in whom no diagnosis is made: Secondary | ICD-10-CM | POA: Insufficient documentation

## 2023-05-04 MED ORDER — CEFTRIAXONE SODIUM 1 G IJ SOLR
500.0000 mg | Freq: Once | INTRAMUSCULAR | Status: AC
  Administered 2023-05-04: 500 mg via INTRAMUSCULAR
  Filled 2023-05-04: qty 10

## 2023-05-04 MED ORDER — DOXYCYCLINE HYCLATE 100 MG PO CAPS: 100.0000 mg | ORAL_CAPSULE | Freq: Two times a day (BID) | ORAL | 0 refills | Status: DC

## 2023-05-04 MED ORDER — DOXYCYCLINE HYCLATE 100 MG PO TABS
100.0000 mg | ORAL_TABLET | Freq: Once | ORAL | Status: AC
  Administered 2023-05-04: 100 mg via ORAL
  Filled 2023-05-04: qty 1

## 2023-05-04 MED ORDER — STERILE WATER FOR INJECTION IJ SOLN
INTRAMUSCULAR | Status: AC
  Administered 2023-05-04: 2.1 mL
  Filled 2023-05-04: qty 10

## 2023-05-04 NOTE — ED Provider Notes (Signed)
[place] EMERGENCY DEPARTMENT AT [facility] Provider Note   CSN: [id number] Arrival date & time: 05/04/23  0865     History  Chief Complaint  Patient presents with   Exposure to STD    Larry Alvarez is a 38 y.o. male.  Patient with history of STDs previously presents today with concern for STD.  He states that for the last 2 days he is having white penile discharge.  He had intercourse with a new sexual partner.  Feels like gonorrhea or chlamydia that he has had previously.  Denies abdominal pain, nausea, vomiting, or diarrhea.  No fevers or chills.  No penile lesions or testicular pain.  Does not want HIV or syphilis testing.  The history is provided by the patient. No language interpreter was used.  Exposure to STD       Home Medications Prior to Admission medications   Medication Sig Start Date End Date Taking? Authorizing Provider  acetaminophen (TYLENOL) 500 MG tablet Take 1,000 mg by mouth every 6 (six) hours as needed for mild pain.    [provider]  doxycycline (VIBRAMYCIN) 100 MG capsule Take 1 capsule (100 mg total) by mouth 2 (two) times daily. 03/15/23   Halford Decamp, PA-C  ibuprofen (ADVIL) 800 MG tablet Take 1 tablet (800 mg total) by mouth 3 (three) times daily. 05/05/20   Jeannie Fend, PA-C      Allergies    Patient has no known allergies.    Review of Systems   Review of Systems  Genitourinary:  Positive for penile discharge.  All other systems reviewed and are negative.   Physical Exam Updated Vital Signs BP (!) 117/100 (BP Location: Left Arm)   Pulse 80   Temp 98.9 F (37.2 C) (Oral)   Resp 16   Ht 5\' 11"  (1.803 m)   Wt 74.8 kg   SpO2 100%   BMI 23.01 kg/m  Physical Exam Vitals and nursing note reviewed.  Constitutional:      General: He is not in acute distress.    Appearance: Normal appearance. He is normal weight. He is not ill-appearing, toxic-appearing or diaphoretic.  HENT:     Head:  Normocephalic and atraumatic.  Cardiovascular:     Rate and Rhythm: Normal rate.  Pulmonary:     Effort: Pulmonary effort is normal. No respiratory distress.  Abdominal:     General: Abdomen is flat.     Palpations: Abdomen is soft.     Tenderness: There is no abdominal tenderness.  Musculoskeletal:        General: Normal range of motion.     Cervical back: Normal range of motion.  Skin:    General: Skin is warm and dry.  Neurological:     General: No focal deficit present.     Mental Status: He is alert.  Psychiatric:        Mood and Affect: Mood normal.        Behavior: Behavior normal.     ED Results / Procedures / Treatments   Labs (all labs ordered are listed, but only abnormal results are displayed) Labs Reviewed  URINALYSIS, ROUTINE W REFLEX MICROSCOPIC  GC/CHLAMYDIA PROBE AMP ([place]) NOT AT [facility]    EKG None  Radiology No results found.  Procedures Procedures    Medications Ordered in ED Medications  cefTRIAXone (ROCEPHIN) injection 500 mg (500 mg Intramuscular Given 05/04/23 1009)  doxycycline (VIBRA-TABS) tablet 100 mg (100 mg Oral Given 05/04/23 1009)  sterile water (preservative free) injection (2.1 mLs  Given 05/04/23 1009)    ED Course/ Medical Decision Making/ A&P                                 Medical Decision Making Amount and/or Complexity of Data Reviewed Labs: ordered.  Risk Prescription drug management.   Patient presents today with penile discharge, concern for STD.  Patient is afebrile without abdominal tenderness, abdominal pain or painful bowel movements to indicate prostatitis.  Penile exam deferred.  STD cultures obtained for gonorrhea and chlamydia.  Offered HIV and syphilis which patient declined. Patient to be discharged with instructions to follow up with PCP. Discussed importance of using protection when sexually active. Pt understands that they have GC/Chlamydia cultures pending and that they will need to inform all  sexual partners if results return positive. Patient has been treated prophylactically with doxycycline and Rocephin.  Will send prescription for doxycycline as well. Evaluation and diagnostic testing in the emergency department does not suggest an emergent condition requiring admission or immediate intervention beyond what has been performed at this time.  Plan for discharge with close PCP follow-up.  Patient is understanding and amenable with plan, educated on red flag symptoms that would prompt immediate return.  Patient discharged in stable condition.  Final Clinical Impression(s) / ED Diagnoses Final diagnoses:  Penile discharge  Concern about STD in male without diagnosis    Rx / DC Orders ED Discharge Orders          Ordered    doxycycline (VIBRAMYCIN) 100 MG capsule  2 times daily        05/04/23 1018          An After Visit Summary was printed and given to the patient.     Vear Clock 05/04/23 1021    Virgina Norfolk, DO 05/04/23 1349

## 2023-05-04 NOTE — ED Triage Notes (Signed)
Patient reports white discharge from penis and pain States he thinks he has gonorrhea and chlamydia Says he had intercourse two days ago and woke up next morning with symptoms Pain rated 8/10

## 2023-05-04 NOTE — Discharge Instructions (Signed)
As we discussed, you were tested for gonorrhea and chlamydia today.  These take a few days to come back.  You have chosen to go ahead and be treated.  We have given you a shot of 1 antibiotic and the first dose of doxycycline.  I have also sent for doxycycline which you need to fill and take as prescribed in its entirety.  Please monitor your test results on your MyChart online.  If anything is positive, you will need to inform all sexual partners.  Please abstain from sexual intercourse until you have symptom resolution.  Follow-up with your primary care doctor.  Return if development of any new or worsening symptoms.

## 2023-05-06 LAB — GC/CHLAMYDIA PROBE AMP (~~LOC~~) NOT AT ARMC
Chlamydia: NEGATIVE
Comment: NEGATIVE
Comment: NORMAL
Neisseria Gonorrhea: NEGATIVE

## 2023-08-29 ENCOUNTER — Other Ambulatory Visit: Payer: Self-pay

## 2023-08-29 ENCOUNTER — Emergency Department (HOSPITAL_COMMUNITY)
Admission: EM | Admit: 2023-08-29 | Discharge: 2023-08-29 | Disposition: A | Discharge status: Home / Self Care | Payer: Self-pay | Attending: Emergency Medicine | Admitting: Emergency Medicine

## 2023-08-29 ENCOUNTER — Encounter (HOSPITAL_COMMUNITY): Payer: Self-pay

## 2023-08-29 DIAGNOSIS — Z113 Encounter for screening for infections with a predominantly sexual mode of transmission: Secondary | ICD-10-CM | POA: Insufficient documentation

## 2023-08-29 DIAGNOSIS — R300 Dysuria: Secondary | ICD-10-CM | POA: Insufficient documentation

## 2023-08-29 DIAGNOSIS — Z711 Person with feared health complaint in whom no diagnosis is made: Secondary | ICD-10-CM

## 2023-08-29 LAB — URINALYSIS, ROUTINE W REFLEX MICROSCOPIC
Bacteria, UA: NONE SEEN
Bilirubin Urine: NEGATIVE
Glucose, UA: NEGATIVE mg/dL
Ketones, ur: NEGATIVE mg/dL
Leukocytes,Ua: NEGATIVE
Nitrite: NEGATIVE
Protein, ur: NEGATIVE mg/dL
Specific Gravity, Urine: 1.015 (ref 1.005–1.030)
pH: 5 (ref 5.0–8.0)

## 2023-08-29 LAB — GC/CHLAMYDIA PROBE AMP (~~LOC~~) NOT AT ARMC
Chlamydia: NEGATIVE
Comment: NEGATIVE
Comment: NORMAL
Neisseria Gonorrhea: NEGATIVE

## 2023-08-29 MED ORDER — CEFTRIAXONE SODIUM 1 G IJ SOLR
500.0000 mg | Freq: Once | INTRAMUSCULAR | Status: AC
  Administered 2023-08-29: 500 mg via INTRAMUSCULAR
  Filled 2023-08-29: qty 10

## 2023-08-29 MED ORDER — DOXYCYCLINE HYCLATE 100 MG PO CAPS: 100.0000 mg | ORAL_CAPSULE | Freq: Two times a day (BID) | ORAL | 0 refills | Status: DC

## 2023-08-29 MED ORDER — LIDOCAINE HCL (PF) 1 % IJ SOLN
INTRAMUSCULAR | Status: AC
  Administered 2023-08-29: 2.1 mL
  Filled 2023-08-29: qty 30

## 2023-08-29 NOTE — ED Triage Notes (Signed)
 Pt wants to be checked for STD for burning sometimes when he urinates. Denies any discharge. Is sexually active does not use condoms all the time.

## 2023-08-29 NOTE — Discharge Instructions (Addendum)
 Please take antibiotics as prescribed. I would like for you to follow up with your primary care doctor for further evaluation. You may return to the ED for any worsening symptoms.

## 2023-08-29 NOTE — ED Provider Notes (Signed)
 [place] EMERGENCY DEPARTMENT AT [facility] Provider Note   CSN: [id number] Arrival date & time: 08/29/23  0940     History Chief Complaint  Patient presents with   Exposure to STD    Larry Alvarez is a 38 y.o. male patient who presents to the emergency department today for STD exposure.  Patient started having symptoms roughly 2 days ago which she describes as dysuria.  He denies any testicular pain, penile discharge, lesions.  Patient intermittently wears condoms and does endorse a new sexual partner.  Denies fever or chills.   Exposure to STD       Home Medications Prior to Admission medications   Medication Sig Start Date End Date Taking? Authorizing Provider  doxycycline  (VIBRAMYCIN ) 100 MG capsule Take 1 capsule (100 mg total) by mouth 2 (two) times daily. 08/29/23  Yes Jamal Mays, Krystall Kruckenberg M, PA-C  acetaminophen  (TYLENOL ) 500 MG tablet Take 1,000 mg by mouth every 6 (six) hours as needed for mild pain.    [provider]  ibuprofen  (ADVIL ) 800 MG tablet Take 1 tablet (800 mg total) by mouth 3 (three) times daily. 05/05/20   Darlis Eisenmenger, PA-C      Allergies    Patient has no known allergies.    Review of Systems   Review of Systems  All other systems reviewed and are negative.   Physical Exam Updated Vital Signs BP 124/64   Pulse 76   Temp 98.7 F (37.1 C) (Oral)   Resp 18   SpO2 100%  Physical Exam Vitals and nursing note reviewed.  Constitutional:      Appearance: Normal appearance.  HENT:     Head: Normocephalic and atraumatic.  Eyes:     General:        Right eye: No discharge.        Left eye: No discharge.     Conjunctiva/sclera: Conjunctivae normal.  Pulmonary:     Effort: Pulmonary effort is normal.  Skin:    General: Skin is warm and dry.     Findings: No rash.  Neurological:     General: No focal deficit present.     Mental Status: He is alert.  Psychiatric:        Mood and Affect: Mood normal.         Behavior: Behavior normal.     ED Results / Procedures / Treatments   Labs (all labs ordered are listed, but only abnormal results are displayed) Labs Reviewed  URINALYSIS, ROUTINE W REFLEX MICROSCOPIC - Abnormal; Notable for the following components:      Result Value   Color, Urine STRAW (*)    Hgb urine dipstick SMALL (*)    All other components within normal limits  GC/CHLAMYDIA PROBE AMP ([place]) NOT AT [facility]    EKG None  Radiology No results found.  Procedures Procedures    Medications Ordered in ED Medications  cefTRIAXone  (ROCEPHIN ) injection 500 mg (has no administration in time range)    ED Course/ Medical Decision Making/ A&P   {   Click here for ABCD2, HEART and other calculators  Medical Decision Making Larry Alvarez is a 38 y.o. male patient presents emerged from today for with concern of STD exposure.  Patient states he feels similar to when he had an STD in the past.  Will get gonorrhea chlamydia testing in addition to a urinalysis.  UA is grossly normal.  Given the patient's symptoms we will treat with ceftriaxone   and doxycycline  empirically.  Gonorrhea and Chlamydia is obtained and pending.  Strict return precautions were discussed.  He is safe for discharge.   Amount and/or Complexity of Data Reviewed Labs: ordered.  Risk Prescription drug management.    Final Clinical Impression(s) / ED Diagnoses Final diagnoses:  Concern about STD in male without diagnosis    Rx / DC Orders ED Discharge Orders          Ordered    doxycycline  (VIBRAMYCIN ) 100 MG capsule  2 times daily        08/29/23 [address] [place], [place] 08/29/23 1211    Cheyenne Cotta, MD 08/30/23 1125

## 2023-09-23 ENCOUNTER — Emergency Department (HOSPITAL_BASED_OUTPATIENT_CLINIC_OR_DEPARTMENT_OTHER)
Admission: EM | Admit: 2023-09-23 | Discharge: 2023-09-23 | Disposition: A | Discharge status: Home / Self Care | Payer: Self-pay | Attending: Emergency Medicine | Admitting: Emergency Medicine

## 2023-09-23 ENCOUNTER — Other Ambulatory Visit: Payer: Self-pay

## 2023-09-23 ENCOUNTER — Encounter (HOSPITAL_BASED_OUTPATIENT_CLINIC_OR_DEPARTMENT_OTHER): Payer: Self-pay | Admitting: Emergency Medicine

## 2023-09-23 DIAGNOSIS — Z202 Contact with and (suspected) exposure to infections with a predominantly sexual mode of transmission: Secondary | ICD-10-CM | POA: Insufficient documentation

## 2023-09-23 LAB — URINALYSIS, ROUTINE W REFLEX MICROSCOPIC
Bilirubin Urine: NEGATIVE
Glucose, UA: NEGATIVE mg/dL
Hgb urine dipstick: NEGATIVE
Ketones, ur: NEGATIVE mg/dL
Leukocytes,Ua: NEGATIVE
Nitrite: NEGATIVE
Protein, ur: NEGATIVE mg/dL
Specific Gravity, Urine: >=1.03 (ref 1.005–1.030)
pH: 5.5 (ref 5.0–8.0)

## 2023-09-23 MED ORDER — METRONIDAZOLE 500 MG PO TABS: 500.0000 mg | ORAL_TABLET | Freq: Two times a day (BID) | ORAL | 0 refills | Status: DC

## 2023-09-23 MED ORDER — LIDOCAINE HCL (PF) 1 % IJ SOLN
1.0000 mL | Freq: Once | INTRAMUSCULAR | Status: AC
  Filled 2023-09-23: qty 5

## 2023-09-23 MED ORDER — DOXYCYCLINE HYCLATE 100 MG PO TABS: 100.0000 mg | ORAL_TABLET | Freq: Two times a day (BID) | ORAL | 0 refills | Status: AC

## 2023-09-23 MED ORDER — CEFTRIAXONE SODIUM 500 MG IJ SOLR
500.0000 mg | Freq: Once | INTRAMUSCULAR | Status: AC
  Administered 2023-09-23: 500 mg via INTRAMUSCULAR
  Filled 2023-09-23: qty 500

## 2023-09-23 NOTE — ED Triage Notes (Signed)
 Pt POV steady gait- reports sexual partner recently tested positive for trichomonas. Denies redness, swelling, discharge.

## 2023-09-23 NOTE — ED Provider Notes (Signed)
 [place] EMERGENCY DEPARTMENT AT [facility] Provider Note   CSN: [id number] Arrival date & time: 09/23/23  1451     Patient presents with: Exposure to STD   Larry Alvarez is a 38 y.o. male who presents out of concern for STD exposure as his sexual partner was tested positive today for trichomonas.  Currently he denies any dysuria, denies any other GU symptoms, and is primarily concerned that since she has an infection and they have been recently sexually active that he also has an infection.  Based on discussion with this patient, he declines any testing for HIV or syphilis as he does not desire any venipuncture at this time.  Specifically request that he be tract tested for trichomonas, as well as testing for gonorrhea and chlamydia.    Exposure to STD       Prior to Admission medications   Medication Sig Start Date End Date Taking? Authorizing Provider  doxycycline  (VIBRA -TABS) 100 MG tablet Take 1 tablet (100 mg total) by mouth 2 (two) times daily for 7 days. 09/23/23 09/30/23 Yes Myriam Dorn BROCKS, PA  metroNIDAZOLE  (FLAGYL ) 500 MG tablet Take 1 tablet (500 mg total) by mouth 2 (two) times daily. 09/23/23  Yes Myriam Dorn BROCKS, PA  acetaminophen  (TYLENOL ) 500 MG tablet Take 1,000 mg by mouth every 6 (six) hours as needed for mild pain.    [provider]  doxycycline  (VIBRAMYCIN ) 100 MG capsule Take 1 capsule (100 mg total) by mouth 2 (two) times daily. 08/29/23   Theotis Cameron HERO, PA-C  ibuprofen  (ADVIL ) 800 MG tablet Take 1 tablet (800 mg total) by mouth 3 (three) times daily. 05/05/20   Beverley Leita LABOR, PA-C    Allergies: Patient has no known allergies.    Review of Systems  Genitourinary:  Negative for difficulty urinating, dysuria, genital sores, hematuria, penile discharge and penile pain.  All other systems reviewed and are negative.   Updated Vital Signs BP 119/77 (BP Location: Left Arm)   Pulse 71   Temp 97.6 F (36.4 C)   Resp 18    Ht 5' 11 (1.803 m)   Wt 77.1 kg   SpO2 99%   BMI 23.71 kg/m   Physical Exam Vitals and nursing note reviewed.  Constitutional:      General: He is not in acute distress.    Appearance: He is well-developed.  HENT:     Head: Normocephalic and atraumatic.   Eyes:     Conjunctiva/sclera: Conjunctivae normal.    Cardiovascular:     Rate and Rhythm: Normal rate and regular rhythm.     Heart sounds: No murmur heard. Pulmonary:     Effort: Pulmonary effort is normal. No respiratory distress.     Breath sounds: Normal breath sounds.  Abdominal:     Palpations: Abdomen is soft.     Tenderness: There is no abdominal tenderness.   Musculoskeletal:        General: No swelling.     Cervical back: Neck supple.   Skin:    General: Skin is warm and dry.     Capillary Refill: Capillary refill takes less than 2 seconds.   Neurological:     Mental Status: He is alert.   Psychiatric:        Mood and Affect: Mood normal.     (all labs ordered are listed, but only abnormal results are displayed) Labs Reviewed  URINALYSIS, ROUTINE W REFLEX MICROSCOPIC  GC/CHLAMYDIA PROBE AMP ([place]) NOT AT  [facility]    EKG: None  Radiology: No results found.   Procedures   Medications Ordered in the ED  cefTRIAXone  (ROCEPHIN ) injection 500 mg (has no administration in time range)  lidocaine  (PF) (XYLOCAINE ) 1 % injection 1-2.1 mL (has no administration in time range)                                    Medical Decision Making Amount and/or Complexity of Data Reviewed Labs: ordered.  Risk Prescription drug management.   Medical Decision Making:   Larry Alvarez is a 38 y.o. male who presented to the ED today with concern for STD exposure detailed above.     Complete initial physical exam performed, notably the patient  was alert and oriented in no apparent distress.  He has no symptoms at present, physical exam is unremarkable. .    Reviewed and confirmed nursing  documentation for past medical history, family history, social history.    Initial Assessment:   With the patient's presentation of concerns for STD exposure, most likely diagnosis is trichomonas as well as potential gonorrhea chlamydia infection.    Initial Plan:  Obtain gonorrhea/committee a testing by urine. Urinalysis with reflex culture ordered to evaluate for UTI or relevant urologic/nephrologic pathology.  Patient declines any testing for RPR/HIV due to hesitance for venipuncture Objective evaluation as below reviewed   Initial Study Results:   Laboratory  All laboratory results reviewed without evidence of clinically relevant pathology.   Exceptions include: UA unremarkable, gonorrhea chlamydia pending  Reassessment and Plan:   Based on the clinical history and recent exposure, will prophylactically treat patient for trichomonas with Flagyl , as well as provide prophylaxis for gonorrhea chlamydia as requested by the patient with IM Rocephin  and oral doxycycline .  Directed to follow-up with primary care in the next week to 2 weeks for monitoring of progression of his condition.       Final diagnoses:  STD exposure    ED Discharge Orders          Ordered    metroNIDAZOLE  (FLAGYL ) 500 MG tablet  2 times daily        09/23/23 1631    doxycycline  (VIBRA -TABS) 100 MG tablet  2 times daily        09/23/23 1633               Myriam Dorn BROCKS, [place] 09/23/23 1638    Darra Fonda MATSU, MD 10/04/23 1806

## 2023-09-23 NOTE — ED Notes (Signed)
 No adverse reaction from rocephin . Reviewed discharge instruction, follow up and medication with pt. Pt states understanding

## 2023-09-23 NOTE — Discharge Instructions (Signed)
 Please follow-up with your primary care in the next week to 2 weeks to monitor progression of your condition.  If you have any worsening of your symptoms, new onset of painful urination, or pelvic pain, please return to the ER for further evaluation.

## 2023-09-24 LAB — GC/CHLAMYDIA PROBE AMP (~~LOC~~) NOT AT ARMC
Chlamydia: NEGATIVE
Comment: NEGATIVE
Comment: NORMAL
Neisseria Gonorrhea: NEGATIVE

## 2023-10-07 ENCOUNTER — Other Ambulatory Visit: Payer: Self-pay

## 2023-10-07 ENCOUNTER — Emergency Department (HOSPITAL_BASED_OUTPATIENT_CLINIC_OR_DEPARTMENT_OTHER)
Admission: EM | Admit: 2023-10-07 | Discharge: 2023-10-07 | Disposition: A | Discharge status: Home / Self Care | Payer: Self-pay | Attending: Emergency Medicine | Admitting: Emergency Medicine

## 2023-10-07 ENCOUNTER — Encounter (HOSPITAL_BASED_OUTPATIENT_CLINIC_OR_DEPARTMENT_OTHER): Payer: Self-pay

## 2023-10-07 DIAGNOSIS — N342 Other urethritis: Secondary | ICD-10-CM | POA: Insufficient documentation

## 2023-10-07 LAB — URINALYSIS, MICROSCOPIC (REFLEX)

## 2023-10-07 LAB — URINALYSIS, ROUTINE W REFLEX MICROSCOPIC
Bilirubin Urine: NEGATIVE
Glucose, UA: NEGATIVE mg/dL
Ketones, ur: NEGATIVE mg/dL
Leukocytes,Ua: NEGATIVE
Nitrite: NEGATIVE
Protein, ur: NEGATIVE mg/dL
Specific Gravity, Urine: 1.025 (ref 1.005–1.030)
pH: 5.5 (ref 5.0–8.0)

## 2023-10-07 LAB — HIV ANTIBODY (ROUTINE TESTING W REFLEX): HIV Screen 4th Generation wRfx: NONREACTIVE

## 2023-10-07 MED ORDER — DOXYCYCLINE HYCLATE 100 MG PO CAPS: 100.0000 mg | ORAL_CAPSULE | Freq: Two times a day (BID) | ORAL | 0 refills | Status: AC

## 2023-10-07 MED ORDER — CEFTRIAXONE SODIUM 500 MG IJ SOLR
500.0000 mg | Freq: Once | INTRAMUSCULAR | Status: AC
  Administered 2023-10-07: 500 mg via INTRAMUSCULAR
  Filled 2023-10-07: qty 500

## 2023-10-07 NOTE — ED Provider Notes (Signed)
 [place] EMERGENCY DEPARTMENT AT [facility] Provider Note   CSN: [id number] Arrival date & time: 10/07/23  9243     Patient presents with: Penile Discharge   Larry Alvarez is a 38 y.o. male.   HPI 38 year old male presents with penile discharge.  3 days ago he had intercourse with a new sexual partner.  He states that last night he started developing white discharge from his penis.  This morning it was a lot less but still present.  Has an abnormal sensation when urinating but no dysuria.  No testicular pain.  No abdominal pain or fever.  Prior to Admission medications   Medication Sig Start Date End Date Taking? Authorizing Provider  doxycycline  (VIBRAMYCIN ) 100 MG capsule Take 1 capsule (100 mg total) by mouth 2 (two) times daily. One po bid x 7 days 10/07/23  Yes Freddi Hamilton, MD  acetaminophen  (TYLENOL ) 500 MG tablet Take 1,000 mg by mouth every 6 (six) hours as needed for mild pain.    [provider]  ibuprofen  (ADVIL ) 800 MG tablet Take 1 tablet (800 mg total) by mouth 3 (three) times daily. 05/05/20   Beverley Leita LABOR, PA-C    Allergies: Patient has no known allergies.    Review of Systems  Constitutional:  Negative for fever.  Genitourinary:  Positive for penile discharge. Negative for testicular pain.    Updated Vital Signs BP 95/83   Pulse 62   Temp 98.2 F (36.8 C) (Oral)   Resp 16   Wt 74.8 kg   SpO2 99%   BMI 23.01 kg/m   Physical Exam Vitals and nursing note reviewed.  Constitutional:      Appearance: He is well-developed.  HENT:     Head: Normocephalic and atraumatic.  Pulmonary:     Effort: Pulmonary effort is normal.  Abdominal:     General: There is no distension.  Genitourinary:    Penis: Circumcised. No erythema, discharge or swelling.    Skin:    General: Skin is warm and dry.   Neurological:     Mental Status: He is alert.     (all labs ordered are listed, but only abnormal results are displayed) Labs  Reviewed  URINALYSIS, ROUTINE W REFLEX MICROSCOPIC  HIV ANTIBODY (ROUTINE TESTING W REFLEX)  RPR  GC/CHLAMYDIA PROBE AMP ([place]) NOT AT [facility]    EKG: None  Radiology: No results found.   Procedures   Medications Ordered in the ED  cefTRIAXone  (ROCEPHIN ) injection 500 mg (has no administration in time range)                                    Medical Decision Making Amount and/or Complexity of Data Reviewed Labs: ordered.  Risk Prescription drug management.   Patient has been seen many times prior for STI exposure.  Given his concerns, we will certainly test for STIs and he is okay with getting blood work to screen for HIV and syphilis.  I did look back at his previous results and he has had multiple negative gonorrhea and chlamydia test this year.  I discussed that we could consider prophylactically treating with Rocephin  and doxycycline  versus waiting on the results.  Given his level of concern he would like to get treated.  Otherwise, is well-appearing, no testicular pain, and he is stable for discharge.     Final diagnoses:  Urethritis    ED Discharge  Orders          Ordered    doxycycline  (VIBRAMYCIN ) 100 MG capsule  2 times daily        10/07/23 9163               Freddi Hamilton, MD 10/07/23 [phone]

## 2023-10-07 NOTE — ED Notes (Signed)
 Pt in bathroom upon calling to triage. Unable to collect urine specimen at this time

## 2023-10-07 NOTE — Discharge Instructions (Signed)
 You can follow-up your results in MyChart.  We are treating you with a shot of Rocephin  and 1 week of doxycycline  to treat your potential sexually transmitted infection.  It is important to abstain from sexual intercourse until you have completely finished the antibiotics and you are symptom-free.  Follow-up with the health department.  If you develop any new or worsening or concerning symptoms then return to the ER.

## 2023-10-07 NOTE — ED Triage Notes (Signed)
 Pt reports thick white penile discharge last night. Pt endorses having sex with new partner

## 2023-10-08 LAB — GC/CHLAMYDIA PROBE AMP (~~LOC~~) NOT AT ARMC
Chlamydia: NEGATIVE
Comment: NEGATIVE
Comment: NORMAL
Neisseria Gonorrhea: NEGATIVE

## 2023-10-08 LAB — RPR: RPR Ser Ql: NONREACTIVE

## 2023-12-11 ENCOUNTER — Emergency Department (HOSPITAL_BASED_OUTPATIENT_CLINIC_OR_DEPARTMENT_OTHER)
Admission: EM | Admit: 2023-12-11 | Discharge: 2023-12-11 | Disposition: A | Discharge status: Home / Self Care | Payer: Self-pay | Attending: Emergency Medicine | Admitting: Emergency Medicine

## 2023-12-11 ENCOUNTER — Other Ambulatory Visit: Payer: Self-pay

## 2023-12-11 ENCOUNTER — Encounter (HOSPITAL_BASED_OUTPATIENT_CLINIC_OR_DEPARTMENT_OTHER): Payer: Self-pay | Admitting: Emergency Medicine

## 2023-12-11 DIAGNOSIS — R369 Urethral discharge, unspecified: Secondary | ICD-10-CM | POA: Insufficient documentation

## 2023-12-11 DIAGNOSIS — Z113 Encounter for screening for infections with a predominantly sexual mode of transmission: Secondary | ICD-10-CM | POA: Insufficient documentation

## 2023-12-11 MED ORDER — CEFTRIAXONE SODIUM 500 MG IJ SOLR
500.0000 mg | Freq: Once | INTRAMUSCULAR | Status: AC
  Administered 2023-12-11: 500 mg via INTRAMUSCULAR
  Filled 2023-12-11: qty 500

## 2023-12-11 MED ORDER — DOXYCYCLINE HYCLATE 100 MG PO TABS: 100.0000 mg | ORAL_TABLET | Freq: Two times a day (BID) | ORAL | 0 refills | Status: AC

## 2023-12-11 MED ORDER — LIDOCAINE HCL (PF) 1 % IJ SOLN
1.0000 mL | Freq: Once | INTRAMUSCULAR | Status: AC
  Administered 2023-12-11: 1 mL
  Filled 2023-12-11: qty 5

## 2023-12-11 NOTE — Discharge Instructions (Signed)
 It was a pleasure taking care of you today. As discussed, your gonorrhea and Chlamydia test are pending.  I am sending you home with antibiotics.  Take as prescribed and finish all antibiotics.  Continue to use protection during intercourse.  Return to the ER for new or worsening symptoms.

## 2023-12-11 NOTE — ED Triage Notes (Signed)
 Penile discharge x 2 days , requesting STD discharge .

## 2023-12-11 NOTE — ED Provider Notes (Signed)
 [facility] Provider Note   CSN: [id number] Arrival date & time: 12/11/23  1641     Patient presents with: Penile Discharge   Larry Alvarez is a 38 y.o. male with a past medical history of previous STIs who presents to the ED due to penile discharge x 2 days.  Patient concern for an STI.  No abdominal pain.  Denies testicular pain or edema.  No fever or chills.  No difficulties with bowel movements. Currently sexually active. Denies dysuria.   History obtained from patient and past medical records. No interpreter used during encounter.       Prior to Admission medications   Medication Sig Start Date End Date Taking? Authorizing Provider  doxycycline  (VIBRA -TABS) 100 MG tablet Take 1 tablet (100 mg total) by mouth 2 (two) times daily. 12/11/23  Yes Kymari Lollis, Aleck BROCKS, PA-C  acetaminophen  (TYLENOL ) 500 MG tablet Take 1,000 mg by mouth every 6 (six) hours as needed for mild pain.    [provider]  doxycycline  (VIBRAMYCIN ) 100 MG capsule Take 1 capsule (100 mg total) by mouth 2 (two) times daily. One po bid x 7 days 10/07/23   Freddi Hamilton, MD  ibuprofen  (ADVIL ) 800 MG tablet Take 1 tablet (800 mg total) by mouth 3 (three) times daily. 05/05/20   Beverley Leita LABOR, PA-C    Allergies: Patient has no known allergies.    Review of Systems  Constitutional:  Negative for fever.  Gastrointestinal:  Negative for abdominal pain.  Genitourinary:  Positive for penile discharge. Negative for dysuria, penile swelling, scrotal swelling and testicular pain.    Updated Vital Signs BP 133/88 (BP Location: Left Arm)   Pulse 66   Temp 98.2 F (36.8 C) (Oral)   Resp 16   Wt 77.1 kg   SpO2 98%   BMI 23.71 kg/m   Physical Exam Vitals and nursing note reviewed.  Constitutional:      General: He is not in acute distress.    Appearance: He is not ill-appearing.  HENT:     Head: Normocephalic.  Eyes:     Pupils: Pupils are equal,  round, and reactive to light.  Cardiovascular:     Rate and Rhythm: Normal rate and regular rhythm.     Pulses: Normal pulses.     Heart sounds: Normal heart sounds. No murmur heard.    No friction rub. No gallop.  Pulmonary:     Effort: Pulmonary effort is normal.     Breath sounds: Normal breath sounds.  Abdominal:     General: Abdomen is flat. There is no distension.     Palpations: Abdomen is soft.     Tenderness: There is no abdominal tenderness. There is no guarding or rebound.  Genitourinary:    Comments: Patient declined GU exam Musculoskeletal:        General: Normal range of motion.     Cervical back: Neck supple.  Skin:    General: Skin is warm and dry.  Neurological:     General: No focal deficit present.     Mental Status: He is alert.  Psychiatric:        Mood and Affect: Mood normal.        Behavior: Behavior normal.     (all labs ordered are listed, but only abnormal results are displayed) Labs Reviewed  GC/CHLAMYDIA PROBE AMP ([place]) NOT AT [facility]    EKG: None  Radiology: No results found.   Procedures  Medications Ordered in the ED  cefTRIAXone  (ROCEPHIN ) injection 500 mg (500 mg Intramuscular Given 12/11/23 1734)  lidocaine  (PF) (XYLOCAINE ) 1 % injection 1-2.1 mL (1 mL Other Given 12/11/23 1734)                                    Medical Decision Making Risk Prescription drug management.   38 year old male presents to the ED requesting STI testing and prophylactic treatment.  Is having penile discharge for the past 2 days.  No other symptoms.  Denies testicular pain and edema.  No dysuria.  Upon arrival, vitals all within normal limits.  Patient in no acute distress.  Abdomen soft, nondistended, nontender.  Patient declined GU exam.  Lower suspicion for testicular torsion or epididymitis given patient declined any pain.  Will prophylactically treat for gonorrhea and chlamydia.  Cultures pending.  Patient discharged with doxycycline .  Patient  stable for discharge. Strict ED precautions discussed with patient. Patient states understanding and agrees to plan. Patient discharged home in no acute distress and stable vitals  No PCP Hx previous STIs    Final diagnoses:  Penile discharge  Screening examination for STI    ED Discharge Orders          Ordered    doxycycline  (VIBRA -TABS) 100 MG tablet  2 times daily        12/11/23 1726               Lorelle Aleck BROCKS, PA-C 12/11/23 1755    Geraldene Hamilton, MD 12/12/23 1642

## 2023-12-12 LAB — GC/CHLAMYDIA PROBE AMP (~~LOC~~) NOT AT ARMC
Chlamydia: NEGATIVE
Comment: NEGATIVE
Comment: NORMAL
Neisseria Gonorrhea: NEGATIVE
